# Patient Record
Sex: Female | Born: 1970 | Race: Black or African American | Hispanic: No | Marital: Married | Smoking: Never smoker
Health system: Southern US, Community
[De-identification: ages and names within clinical notes are randomized; demographics above are authoritative.]

## PROBLEM LIST (undated history)

## (undated) DIAGNOSIS — I10 Essential (primary) hypertension: Secondary | ICD-10-CM

## (undated) HISTORY — PX: NO PAST SURGERIES: SHX2092

---

## 2021-06-23 ENCOUNTER — Emergency Department (HOSPITAL_COMMUNITY)
Admission: EM | Admit: 2021-06-23 | Discharge: 2021-06-23 | Disposition: A | Discharge status: Home / Self Care | Payer: Self-pay | Attending: Emergency Medicine | Admitting: Emergency Medicine

## 2021-06-23 ENCOUNTER — Other Ambulatory Visit: Payer: Self-pay

## 2021-06-23 ENCOUNTER — Encounter (HOSPITAL_COMMUNITY): Payer: Self-pay | Admitting: Emergency Medicine

## 2021-06-23 DIAGNOSIS — G43909 Migraine, unspecified, not intractable, without status migrainosus: Secondary | ICD-10-CM | POA: Insufficient documentation

## 2021-06-23 DIAGNOSIS — G43009 Migraine without aura, not intractable, without status migrainosus: Secondary | ICD-10-CM

## 2021-06-23 DIAGNOSIS — I10 Essential (primary) hypertension: Secondary | ICD-10-CM | POA: Insufficient documentation

## 2021-06-23 HISTORY — DX: Essential (primary) hypertension: I10

## 2021-06-23 MED ORDER — DIPHENHYDRAMINE HCL 50 MG/ML IJ SOLN
25.0000 mg | Freq: Once | INTRAMUSCULAR | Status: AC
  Administered 2021-06-23: 25 mg via INTRAVENOUS
  Filled 2021-06-23: qty 1

## 2021-06-23 MED ORDER — METOCLOPRAMIDE HCL 5 MG/ML IJ SOLN
10.0000 mg | Freq: Once | INTRAMUSCULAR | Status: AC
  Administered 2021-06-23: 10 mg via INTRAVENOUS
  Filled 2021-06-23: qty 2

## 2021-06-23 MED ORDER — KETOROLAC TROMETHAMINE 30 MG/ML IJ SOLN
30.0000 mg | Freq: Once | INTRAMUSCULAR | Status: AC
  Administered 2021-06-23: 30 mg via INTRAVENOUS
  Filled 2021-06-23: qty 1

## 2021-06-23 NOTE — ED Provider Notes (Signed)
[facility] EMERGENCY DEPARTMENT Provider Note   CSN: [id number] Arrival date & time: 06/23/21  1704     History Chief Complaint  Patient presents with   Headache    Heather Roach is a 50 y.o. female.   Headache Associated symptoms: nausea and photophobia   Associated symptoms: no abdominal pain, no congestion, no diarrhea, no fever, no neck pain, no neck stiffness, no numbness, no seizures, no sore throat, no vomiting and no weakness        Heather Roach is a 50 y.o. female with past medical history of hypertension who presents to the Emergency Department complaining of gradual onset of frontal headache that began yesterday.  She describes having a throbbing pounding sensation to the front of her head.  Onset while at work yesterday.  States headache is gradually worsened today which prompted her ER visit.  She last took [place] powders at noon today without relief.  Her headache is been associated with nausea, no vomiting.  She also endorses having photophobia.  History of migraines.  She denies chest pain, abdominal pain, shortness of breath, fever or chills, or visual changes.    Past Medical History:  Diagnosis Date   Hypertension     There are no problems to display for this patient.   History reviewed. No pertinent surgical history.   OB History   No obstetric history on file.     No family history on file.  Social History   Tobacco Use   Smoking status: Never    Passive exposure: Never   Smokeless tobacco: Never    Home Medications Prior to Admission medications   Not on File    Allergies    Patient has no allergy information on record.  Review of Systems   Review of Systems  Constitutional:  Negative for appetite change, chills and fever.  HENT:  Negative for congestion and sore throat.   Eyes:  Positive for photophobia.  Respiratory:  Negative for chest tightness and shortness of breath.   Cardiovascular:  Negative for chest pain.   Gastrointestinal:  Positive for nausea. Negative for abdominal pain, diarrhea and vomiting.  Genitourinary:  Negative for dysuria.  Musculoskeletal:  Negative for arthralgias, neck pain and neck stiffness.  Neurological:  Positive for headaches. Negative for seizures, syncope, weakness and numbness.   Physical Exam Updated Vital Signs BP (!) 156/116 (BP Location: Right Arm)   Pulse 79   Temp 98.3 F (36.8 C)   Resp 18   Ht 5\' 5"  (1.651 m)   Wt 72.6 kg   SpO2 99%   BMI 26.63 kg/m   Physical Exam Constitutional:      General: She is not in acute distress.    Appearance: Normal appearance. She is not ill-appearing or toxic-appearing.  HENT:     Head: Normocephalic.     Mouth/Throat:     Mouth: Mucous membranes are moist.  Eyes:     Extraocular Movements: Extraocular movements intact.     Pupils: Pupils are equal, round, and reactive to light.  Neck:     Thyroid: No thyromegaly.     Meningeal: Kernig's sign absent.  Cardiovascular:     Rate and Rhythm: Normal rate and regular rhythm.     Pulses: Normal pulses.  Pulmonary:     Effort: Pulmonary effort is normal.     Breath sounds: Normal breath sounds. No wheezing.  Abdominal:     Palpations: Abdomen is soft.     Tenderness: There is no  abdominal tenderness. There is no guarding or rebound.  Musculoskeletal:        General: Normal range of motion.     Cervical back: Full passive range of motion without pain, normal range of motion and neck supple.  Skin:    General: Skin is warm.     Capillary Refill: Capillary refill takes less than 2 seconds.     Findings: No rash.  Neurological:     General: No focal deficit present.     Mental Status: She is alert and oriented to person, place, and time.     Sensory: No sensory deficit.     Motor: No weakness.     Comments: Cranial nerves II through XII intact.  Speech clear.  No pronator drift.  No facial droop.    ED Results / Procedures / Treatments   Labs (all labs  ordered are listed, but only abnormal results are displayed) Labs Reviewed - No data to display  EKG None  Radiology No results found.  Procedures Procedures   Medications Ordered in ED Medications  ketorolac (TORADOL) 30 MG/ML injection 30 mg (has no administration in time range)  metoCLOPramide (REGLAN) injection 10 mg (has no administration in time range)  diphenhydrAMINE (BENADRYL) injection 25 mg (has no administration in time range)    ED Course  I have reviewed the triage vital signs and the nursing notes.  Pertinent labs & imaging results that were available during my care of the patient were reviewed by me and considered in my medical decision making (see chart for details).    MDM Rules/Calculators/A&P                           Patient with a history of migraines, complains of headache gradual onset yesterday, worse today.  Headache similar to previous.  Headache associated with photophobia and nausea.  No vomiting fevers or chills.  No meningeal signs on exam.  Patient well-appearing nontoxic.  She is hypertensive with history of same.  States she took her blood pressure medication today  Plan includes treatment with migraine cocktail and reassess.  Low clinical suspicion for [facility] or meningitis.  On recheck, patient now reporting headache is much improved states she is ready for discharge home.  Repeat vitals show blood pressure also improved.  Symptoms likely related to migraine headache.  No clinical findings to suggest emergent process.  Patient appears appropriate for discharge home.  She is ambulatory with steady gait.  No focal neuro deficits  On repeat vital signs, patient remains hypertensive, but states that she feels ready to go home.  I have recommended that she recheck her blood pressure this evening before bedtime and take one half of her amlodipine before bedtime if pressure is still elevated.  Doubt any end organ damage.  She is agreeable to plan.  She will  follow-up closely with her PCP tomorrow.  Return precautions were discussed.  Final Clinical Impression(s) / ED Diagnoses Final diagnoses:  Migraine without aura and without status migrainosus, not intractable    Rx / DC Orders ED Discharge Orders     None        Rosey Bath 06/24/21 2110    Bethann Berkshire, MD 06/30/21 1027

## 2021-06-23 NOTE — ED Triage Notes (Signed)
Pt c/o headache since yesterday. C/o nausea and lightsensitivity. Hx of migraines. Took tylenol at 1200 with no relief.

## 2021-06-23 NOTE — Discharge Instructions (Addendum)
You have been treated today for migraine headache.  I recommend that you go home and rest.  Your blood pressure this evening is elevated.  Try rechecking your blood pressure at home before bedtime and take one half tablet of your amlodipine if the top number of your blood pressure is greater than 140.  Call your primary care provider tomorrow for recheck.  Return to emergency department for any new or worsening symptoms.

## 2022-02-11 ENCOUNTER — Encounter (HOSPITAL_COMMUNITY): Payer: Self-pay | Admitting: Emergency Medicine

## 2022-02-11 ENCOUNTER — Emergency Department (HOSPITAL_COMMUNITY): Payer: Self-pay

## 2022-02-11 ENCOUNTER — Other Ambulatory Visit: Payer: Self-pay

## 2022-02-11 ENCOUNTER — Emergency Department (HOSPITAL_COMMUNITY)
Admission: EM | Admit: 2022-02-11 | Discharge: 2022-02-12 | Disposition: A | Discharge status: Home / Self Care | Payer: Self-pay | Attending: Student | Admitting: Student

## 2022-02-11 DIAGNOSIS — R519 Headache, unspecified: Secondary | ICD-10-CM

## 2022-02-11 DIAGNOSIS — Z79899 Other long term (current) drug therapy: Secondary | ICD-10-CM | POA: Insufficient documentation

## 2022-02-11 DIAGNOSIS — I10 Essential (primary) hypertension: Secondary | ICD-10-CM | POA: Insufficient documentation

## 2022-02-11 LAB — COMPREHENSIVE METABOLIC PANEL
ALT: 24 U/L (ref 0–44)
AST: 20 U/L (ref 15–41)
Albumin: 4.2 g/dL (ref 3.5–5.0)
Alkaline Phosphatase: 55 U/L (ref 38–126)
Anion gap: 6 (ref 5–15)
BUN: 14 mg/dL (ref 6–20)
CO2: 24 mmol/L (ref 22–32)
Calcium: 8.8 mg/dL — ABNORMAL LOW (ref 8.9–10.3)
Chloride: 106 mmol/L (ref 98–111)
Creatinine, Ser: 0.85 mg/dL (ref 0.44–1.00)
GFR, Estimated: >60 mL/min (ref >60)
Glucose, Bld: 126 mg/dL — ABNORMAL HIGH (ref 70–99)
Potassium: 3.6 mmol/L (ref 3.5–5.1)
Sodium: 136 mmol/L (ref 135–145)
Total Bilirubin: 0.1 mg/dL — ABNORMAL LOW (ref 0.3–1.2)
Total Protein: 7.8 g/dL (ref 6.5–8.1)

## 2022-02-11 LAB — CBC WITH DIFFERENTIAL/PLATELET
Abs Immature Granulocytes: 0.03 10*3/uL (ref 0.00–0.07)
Basophils Absolute: 0.1 10*3/uL (ref 0.0–0.1)
Basophils Relative: 1 %
Eosinophils Absolute: 0.2 10*3/uL (ref 0.0–0.5)
Eosinophils Relative: 3 %
HCT: 37 % (ref 36.0–46.0)
Hemoglobin: 12.2 g/dL (ref 12.0–15.0)
Immature Granulocytes: 0 %
Lymphocytes Relative: 41 %
Lymphs Abs: 2.9 10*3/uL (ref 0.7–4.0)
MCH: 29.3 pg (ref 26.0–34.0)
MCHC: 33 g/dL (ref 30.0–36.0)
MCV: 88.7 fL (ref 80.0–100.0)
Monocytes Absolute: 0.7 10*3/uL (ref 0.1–1.0)
Monocytes Relative: 10 %
Neutro Abs: 3.2 10*3/uL (ref 1.7–7.7)
Neutrophils Relative %: 45 %
Platelets: 292 10*3/uL (ref 150–400)
RBC: 4.17 MIL/uL (ref 3.87–5.11)
RDW: 13.2 % (ref 11.5–15.5)
WBC: 7 10*3/uL (ref 4.0–10.5)
nRBC: 0 % (ref 0.0–0.2)

## 2022-02-11 LAB — URINALYSIS, ROUTINE W REFLEX MICROSCOPIC
Bilirubin Urine: NEGATIVE
Glucose, UA: NEGATIVE mg/dL
Ketones, ur: NEGATIVE mg/dL
Leukocytes,Ua: NEGATIVE
Nitrite: NEGATIVE
Protein, ur: 30 mg/dL — AB
Specific Gravity, Urine: 1.026 (ref 1.005–1.030)
pH: 5 (ref 5.0–8.0)

## 2022-02-11 LAB — TROPONIN I (HIGH SENSITIVITY): Troponin I (High Sensitivity): 4 ng/L (ref <18)

## 2022-02-11 MED ORDER — DIPHENHYDRAMINE HCL 50 MG/ML IJ SOLN
12.5000 mg | Freq: Once | INTRAMUSCULAR | Status: AC
  Administered 2022-02-11: 12.5 mg via INTRAVENOUS
  Filled 2022-02-11: qty 1

## 2022-02-11 MED ORDER — AMLODIPINE BESYLATE 5 MG PO TABS: 5.0000 mg | ORAL_TABLET | Freq: Every day | ORAL | 0 refills | Status: DC

## 2022-02-11 MED ORDER — METOCLOPRAMIDE HCL 5 MG/ML IJ SOLN
10.0000 mg | Freq: Once | INTRAMUSCULAR | Status: AC
  Administered 2022-02-11: 10 mg via INTRAVENOUS
  Filled 2022-02-11: qty 2

## 2022-02-11 MED ORDER — SODIUM CHLORIDE 0.9 % IV BOLUS
500.0000 mL | Freq: Once | INTRAVENOUS | Status: AC
  Administered 2022-02-11: 500 mL via INTRAVENOUS

## 2022-02-11 MED ORDER — AMLODIPINE BESYLATE 5 MG PO TABS
5.0000 mg | ORAL_TABLET | Freq: Once | ORAL | Status: AC
  Administered 2022-02-11: 5 mg via ORAL
  Filled 2022-02-11: qty 1

## 2022-02-11 MED ORDER — ACETAMINOPHEN 500 MG PO TABS
1000.0000 mg | ORAL_TABLET | Freq: Once | ORAL | Status: AC
  Administered 2022-02-11: 1000 mg via ORAL
  Filled 2022-02-11: qty 2

## 2022-02-11 NOTE — ED Provider Notes (Signed)
?[facility] EMERGENCY DEPARTMENT ?Provider Note ? ? ?CSN: [id number] ?Arrival date & time: 02/11/22  1830 ? ?  ? ?History ? ?Chief Complaint  ?Patient presents with  ? Headache  ? ? ?Heather Roach is a 51 y.o. female. ? ?HPI ?Patient is a [age] year old female with a history of hypertension who presents to the emergency department due to headaches.  Her wife is at bedside and states that they were in [place] about 2 weeks ago and were eating fatty foods including pork and ribs.  Shortly thereafter she began developing waxing and waning frontal headaches.  States that they have been occurring daily for the past 2 weeks.  A few days ago she began developing intermittent nausea and vomiting with the headaches as well.  Yesterday she then began developing cramping in the left arm that she states has now spread to the left side of her chest.  Denies any shortness of breath, abdominal pain, visual changes, numbness, weakness.  States that she used to take amlodipine for hypertension but discontinued this about 2 years ago. ?  ? ?Home Medications ?Prior to Admission medications   ?Medication Sig Start Date End Date Taking? Authorizing Provider  ?amLODipine (NORVASC) 5 MG tablet Take 1 tablet (5 mg total) by mouth daily. 02/11/22  Yes Placido SouJoldersma, Noha Karasik, PA-C  ?   ? ?Allergies    ?Patient has no known allergies.   ? ?Review of Systems   ?Review of Systems  ?All other systems reviewed and are negative. ?Ten systems reviewed and are negative for acute change, except as noted in the HPI.   ?Physical Exam ?Updated Vital Signs ?BP (!) 162/108   Pulse 83   Temp 98.3 ?F (36.8 ?C) (Oral)   Resp (!) 31   Ht 5\' 5"  (1.651 m)   Wt 72.6 kg   LMP 12/26/2021 (Approximate)   SpO2 99%   BMI 26.63 kg/m?  ?Physical Exam ?Vitals and nursing note reviewed.  ?Constitutional:   ?   General: She is not in acute distress. ?   Appearance: Normal appearance. She is well-developed. She is not ill-appearing, toxic-appearing or diaphoretic.  ?HENT:  ?    Head: Normocephalic and atraumatic.  ?   Right Ear: External ear normal.  ?   Left Ear: External ear normal.  ?   Nose: Nose normal.  ?   Mouth/Throat:  ?   Mouth: Mucous membranes are moist.  ?   Pharynx: Oropharynx is clear. No oropharyngeal exudate or posterior oropharyngeal erythema.  ?Eyes:  ?   General: No scleral icterus. ?   Extraocular Movements: Extraocular movements intact.  ?   Right eye: Normal extraocular motion and no nystagmus.  ?   Left eye: Normal extraocular motion and no nystagmus.  ?   Pupils: Pupils are equal, round, and reactive to light. Pupils are equal.  ?   Right eye: Pupil is round and reactive.  ?   Left eye: Pupil is round and reactive.  ?Cardiovascular:  ?   Rate and Rhythm: Normal rate and regular rhythm.  ?   Pulses: Normal pulses.  ?   Heart sounds: Normal heart sounds. No murmur heard. ?  No friction rub. No gallop.  ?Pulmonary:  ?   Effort: Pulmonary effort is normal. No respiratory distress.  ?   Breath sounds: Normal breath sounds. No stridor. No wheezing, rhonchi or rales.  ?Abdominal:  ?   General: Abdomen is flat.  ?   Palpations: Abdomen is soft.  ?  Tenderness: There is no abdominal tenderness.  ?Musculoskeletal:     ?   General: Normal range of motion.  ?   Cervical back: Normal range of motion and neck supple. No tenderness.  ?Skin: ?   General: Skin is warm and dry.  ?Neurological:  ?   General: No focal deficit present.  ?   Mental Status: She is alert and oriented to person, place, and time.  ?   GCS: GCS eye subscore is 4. GCS verbal subscore is 5. GCS motor subscore is 6.  ?   Comments: A&O x3.  Speaking clearly and coherently.  Moving all 4 extremities.  No gross deficits.  ?Psychiatric:     ?   Mood and Affect: Mood normal.     ?   Behavior: Behavior normal.  ? ?ED Results / Procedures / Treatments   ?Labs ?(all labs ordered are listed, but only abnormal results are displayed) ?Labs Reviewed  ?COMPREHENSIVE METABOLIC PANEL - Abnormal; Notable for the following  components:  ?    Result Value  ? Glucose, Bld 126 (*)   ? Calcium 8.8 (*)   ? Total Bilirubin 0.1 (*)   ? All other components within normal limits  ?URINALYSIS, ROUTINE W REFLEX MICROSCOPIC - Abnormal; Notable for the following components:  ? APPearance HAZY (*)   ? Hgb urine dipstick SMALL (*)   ? Protein, ur 30 (*)   ? Bacteria, UA RARE (*)   ? All other components within normal limits  ?CBC WITH DIFFERENTIAL/PLATELET  ?TROPONIN I (HIGH SENSITIVITY)  ?TROPONIN I (HIGH SENSITIVITY)  ? ?EKG ?EKG Interpretation ? ?Date/Time:  Tuesday February 11 2022 21:52:00 EDT ?Ventricular Rate:  82 ?PR Interval:  154 ?QRS Duration: 96 ?QT Interval:  378 ?QTC Calculation: 441 ?R Axis:   11 ?Text Interpretation: Normal sinus rhythm Normal ECG No previous ECGs available Confirmed by Zadie Rhine ([id number]) on 02/11/2022 11:11:07 PM ? ?Radiology ?CT Head Wo Contrast ? ?Result Date: 02/11/2022 ?CLINICAL DATA:  Headache. EXAM: CT HEAD WITHOUT CONTRAST TECHNIQUE: Contiguous axial images were obtained from the base of the skull through the vertex without intravenous contrast. RADIATION DOSE REDUCTION: This exam was performed according to the departmental dose-optimization program which includes automated exposure control, adjustment of the mA and/or kV according to patient size and/or use of iterative reconstruction technique. COMPARISON:  None. FINDINGS: Brain: The ventricles and sulci are appropriate size for the patient's age. The gray-white matter discrimination is preserved. There is no acute intracranial hemorrhage. No mass effect or midline shift. No extra-axial fluid collection. Vascular: No hyperdense vessel or unexpected calcification. Skull: Normal. Negative for fracture or focal lesion. Sinuses/Orbits: No acute finding. Other: None IMPRESSION: Unremarkable noncontrast CT of the brain. Electronically Signed   By: Elgie Collard M.D.   On: 02/11/2022 21:28  ? ?DG Chest Portable 1 View ? ?Result Date: 02/11/2022 ?CLINICAL DATA:   Chest pain. EXAM: PORTABLE CHEST 1 VIEW COMPARISON:  None. FINDINGS: The heart size and mediastinal contours are within normal limits. Both lungs are clear. The visualized skeletal structures are unremarkable. IMPRESSION: No active disease. Electronically Signed   By: Elgie Collard M.D.   On: 02/11/2022 21:20   ? ?Procedures ?Procedures  ? ?Medications Ordered in ED ?Medications  ?acetaminophen (TYLENOL) tablet 1,000 mg (1,000 mg Oral Given 02/11/22 2125)  ?diphenhydrAMINE (BENADRYL) injection 12.5 mg (12.5 mg Intravenous Given 02/11/22 2140)  ?metoCLOPramide (REGLAN) injection 10 mg (10 mg Intravenous Given 02/11/22 2140)  ?sodium chloride 0.9 % bolus 500 mL (  500 mLs Intravenous New Bag/Given 02/11/22 2140)  ?amLODipine (NORVASC) tablet 5 mg (5 mg Oral Given 02/11/22 2235)  ? ?ED Course/ Medical Decision Making/ A&P ?  ?                        ?Medical Decision Making ?Amount and/or Complexity of Data Reviewed ?Labs: ordered. ?Radiology: ordered. ? ?Risk ?OTC drugs. ?Prescription drug management. ? ?Pt is a 51 y.o. female with history of hypertension who presents to the emergency department with a waxing waning headache for the past 2 weeks as well as cramping in the left arm and left chest.  Patient states she used to take amlodipine but discontinued this medication about 2 years ago and has not been on any antihypertensive since. ? ?Labs: ?CBC without abnormalities. ?CMP with glucose 126, calcium of 8.8, total bilirubin 0.1. ?UA with small hemoglobin, 30 protein, rare bacteria. ?Troponin of 4. ? ?Imaging: ?Chest x-ray shows no active disease. ?CT scan of the head without contrast shows unremarkable noncontrast CT of the brain. ? ?I, Placido Sou, PA-C, personally reviewed and evaluated these images and lab results as part of my medical decision-making. ? ?On my initial exam heart is regular rate and rhythm without murmurs, rubs, or gallops.  Lungs are clear to auscultation bilaterally.  Abdomen is soft and  nontender.  A&O x3.  Moving all 4 extremities with ease.  No gross deficits.  Given her complaints of cramping in the chest, left arm, nausea, vomiting, as well as significantly elevated blood pressure I obtained a

## 2022-02-11 NOTE — Discharge Instructions (Addendum)
I am prescribing you a blood pressure medication called amlodipine.  Please take this once per day as prescribed. ? ?Below is the contact information for [facility] and wellness.  Please give them a call at your convenience to schedule an appointment for reevaluation. ? ?If you develop any new or worsening symptoms please come back to the emergency department. ?

## 2022-02-11 NOTE — ED Triage Notes (Signed)
Pt has c/o of headache since 01/27/22, has develop with emesis last couple days, pt hypertensive in triage 181/116. ?

## 2022-02-12 LAB — TROPONIN I (HIGH SENSITIVITY): Troponin I (High Sensitivity): 4 ng/L (ref <18)

## 2022-02-12 NOTE — ED Provider Notes (Signed)
Patient improved, no acute distress.  She feels comfortable for discharge home.  We discussed return precautions ?  ?Zadie Rhine, MD ?02/12/22 0019 ? ?

## 2022-02-20 ENCOUNTER — Ambulatory Visit (INDEPENDENT_AMBULATORY_CARE_PROVIDER_SITE_OTHER): Payer: Self-pay | Admitting: Physician Assistant

## 2022-02-20 ENCOUNTER — Encounter: Payer: Self-pay | Admitting: Physician Assistant

## 2022-02-20 VITALS — BP 169/108 | HR 102 | Temp 98.2°F | Resp 18 | Ht 65.0 in | Wt 165.0 lb

## 2022-02-20 DIAGNOSIS — Z114 Encounter for screening for human immunodeficiency virus [HIV]: Secondary | ICD-10-CM

## 2022-02-20 DIAGNOSIS — Z6827 Body mass index (BMI) 27.0-27.9, adult: Secondary | ICD-10-CM

## 2022-02-20 DIAGNOSIS — Z13220 Encounter for screening for lipoid disorders: Secondary | ICD-10-CM

## 2022-02-20 DIAGNOSIS — R7309 Other abnormal glucose: Secondary | ICD-10-CM

## 2022-02-20 DIAGNOSIS — R000 Tachycardia, unspecified: Secondary | ICD-10-CM

## 2022-02-20 DIAGNOSIS — Z1159 Encounter for screening for other viral diseases: Secondary | ICD-10-CM

## 2022-02-20 DIAGNOSIS — Z1231 Encounter for screening mammogram for malignant neoplasm of breast: Secondary | ICD-10-CM

## 2022-02-20 DIAGNOSIS — I10 Essential (primary) hypertension: Secondary | ICD-10-CM

## 2022-02-20 MED ORDER — HYDROCHLOROTHIAZIDE 25 MG PO TABS
25.0000 mg | ORAL_TABLET | Freq: Every day | ORAL | 1 refills | Status: DC
Start: 2022-02-20 — End: 2022-04-07

## 2022-02-20 NOTE — Progress Notes (Signed)
? ?New Patient Office Visit ? ?Subjective:  ?Patient ID: Heather Roach, female    DOB: 1971-02-09  Age: 51 y.o. MRN: [id number] ? ?CC:  ?Chief Complaint  ?Patient presents with  ? Hypertension  ? ? ?HPI ?Heather Roach states that she was seen at the emergency department on February 11, 2022.  Hospital course: ? ?HPI ?Patient is a 51 year old female with a history of hypertension who presents to the emergency department due to headaches.  Her wife is at bedside and states that they were in [place] about 2 weeks ago and were eating fatty foods including pork and ribs.  Shortly thereafter she began developing waxing and waning frontal headaches.  States that they have been occurring daily for the past 2 weeks.  A few days ago she began developing intermittent nausea and vomiting with the headaches as well.  Yesterday she then began developing cramping in the left arm that she states has now spread to the left side of her chest.  Denies any shortness of breath, abdominal pain, visual changes, numbness, weakness.  States that she used to take amlodipine for hypertension but discontinued this about 2 years ago. ? ?Medical Decision Making ?Amount and/or Complexity of Data Reviewed ?Labs: ordered. ?Radiology: ordered. ?  ?Risk ?OTC drugs. ?Prescription drug management. ?  ?Pt is a 51 y.o. female with history of hypertension who presents to the emergency department with a waxing waning headache for the past 2 weeks as well as cramping in the left arm and left chest.  Patient states she used to take amlodipine but discontinued this medication about 2 years ago and has not been on any antihypertensive since. ?  ?Labs: ?CBC without abnormalities. ?CMP with glucose 126, calcium of 8.8, total bilirubin 0.1. ?UA with small hemoglobin, 30 protein, rare bacteria. ?Troponin of 4. ?  ?Imaging: ?Chest x-ray shows no active disease. ?CT scan of the head without contrast shows unremarkable noncontrast CT of the brain. ?  ?I, Placido SouLogan Joldersma,  PA-C, personally reviewed and evaluated these images and lab results as part of my medical decision-making. ?  ?On my initial exam heart is regular rate and rhythm without murmurs, rubs, or gallops.  Lungs are clear to auscultation bilaterally.  Abdomen is soft and nontender.  A&O x3.  Moving all 4 extremities with ease.  No gross deficits.  Given her complaints of cramping in the chest, left arm, nausea, vomiting, as well as significantly elevated blood pressure I obtained a basic cardiac work-up as well as a UA.  Patient also endorsing waxing waning headaches for the past 2 weeks so a CT scan was obtained of the head as well.  Imaging of the head appears reassuring.  Chest x-ray also negative.  CBC without abnormalities.  CMP with a calcium of 8.8 but otherwise no electrolyte derangements.  Normal kidney function.  No signs of endorgan damage.  Exam not consistent with hypertensive encephalopathy. ? ?Patient's symptoms were treated with Tylenol, Reglan, Benadryl, IV fluids, as well as amlodipine.  She reports significant improvement in her headache.  Will discharge on a course of amlodipine.  We will also [facility]. ? ?It is the end of my shift and patient care is being transferred to Dr. Bebe ShaggyWickline.  Patient pending a second troponin.   ?  ? ?States today that she did start taking the amlodipine, states that she started having lower lip swelling after 2 days, states that she did speak with a pharmacist who  told her to stop the medication, but then states when they pharmacist saw her elevated blood pressure readings told her to continue the medication.  States that she is no longer having the lower lip swelling.  States that her blood pressure readings continue to be elevated at home, similar to today's.  States that she did take amlodipine in the past but does endorse that she was not compliant to the medication.  ? ? ?Past Medical History:  ?Diagnosis Date  ?  Hypertension   ? ? ?History reviewed. No pertinent surgical history. ? ?History reviewed. No pertinent family history. ? ?Social History  ? ?Socioeconomic History  ? Marital status: Married  ?  Spouse name: Not on file  ? Number of children: Not on file  ? Years of education: Not on file  ? Highest education level: Not on file  ?Occupational History  ? Not on file  ?Tobacco Use  ? Smoking status: Never  ?  Passive exposure: Never  ? Smokeless tobacco: Never  ?Substance and Sexual Activity  ? Alcohol use: Not on file  ? Drug use: Not on file  ? Sexual activity: Not on file  ?Other Topics Concern  ? Not on file  ?Social History Narrative  ? Not on file  ? ?Social Determinants of Health  ? ?Financial Resource Strain: Not on file  ?Food Insecurity: Not on file  ?Transportation Needs: Not on file  ?Physical Activity: Not on file  ?Stress: Not on file  ?Social Connections: Not on file  ?Intimate Partner Violence: Not on file  ? ? ?ROS ?Review of Systems  ?Constitutional: Negative.   ?HENT: Negative.    ?Eyes: Negative.   ?Cardiovascular:  Negative for chest pain and palpitations.  ?Gastrointestinal: Negative.   ?Endocrine: Negative.   ?Genitourinary: Negative.   ?Musculoskeletal: Negative.   ?Skin: Negative.   ?Allergic/Immunologic: Negative.   ?Neurological: Negative.   ?Hematological: Negative.   ?Psychiatric/Behavioral: Negative.    ? ?Objective:  ? ?Today's Vitals: BP (!) 169/108 (BP Location: Right Arm, Patient Position: Sitting, Cuff Size: Normal)   Pulse (!) 102   Temp 98.2 ?F (36.8 ?C) (Oral)   Resp 18   Ht 5\' 5"  (1.651 m)   Wt 165 lb (74.8 kg)   LMP 01/27/2022   SpO2 98%   BMI 27.46 kg/m?  ? ?Physical Exam ?Vitals and nursing note reviewed.  ?Constitutional:   ?   Appearance: Normal appearance.  ?HENT:  ?   Head: Normocephalic and atraumatic.  ?   Right Ear: External ear normal.  ?   Left Ear: External ear normal.  ?   Nose: Nose normal.  ?   Mouth/Throat:  ?   Mouth: Mucous membranes are moist.  ?    Pharynx: Oropharynx is clear.  ?Eyes:  ?   Extraocular Movements: Extraocular movements intact.  ?   Conjunctiva/sclera: Conjunctivae normal.  ?   Pupils: Pupils are equal, round, and reactive to light.  ?Cardiovascular:  ?   Rate and Rhythm: Regular rhythm. Tachycardia present.  ?   Pulses: Normal pulses.  ?   Heart sounds: Normal heart sounds.  ?Pulmonary:  ?   Effort: Pulmonary effort is normal.  ?   Breath sounds: Normal breath sounds.  ?Musculoskeletal:  ?   Cervical back: Normal range of motion and neck supple.  ?Skin: ?   General: Skin is warm and dry.  ?Neurological:  ?   General: No focal deficit present.  ?   Mental Status: She is  alert and oriented to person, place, and time.  ?Psychiatric:     ?   Mood and Affect: Mood normal.     ?   Behavior: Behavior normal.     ?   Thought Content: Thought content normal.     ?   Judgment: Judgment normal.  ? ? ?Assessment & Plan:  ? ?Problem List Items Addressed This Visit   ? ?  ? Cardiovascular and Mediastinum  ? Essential hypertension - Primary  ? Relevant Medications  ? hydrochlorothiazide (HYDRODIURIL) 25 MG tablet  ? Other Relevant Orders  ? TSH  ?  ? Other  ? Tachycardia  ? Elevated random blood glucose level  ? Relevant Orders  ? Hemoglobin A1c  ? Hypocalcemia  ? ?Other Visit Diagnoses   ? ? Screening for HIV (human immunodeficiency virus)      ? Relevant Orders  ? HIV antibody (with reflex)  ? Encounter for HCV screening test for low risk patient      ? Relevant Orders  ? HCV Ab w Reflex to Quant PCR  ? Screening mammogram for breast cancer      ? Relevant Orders  ? MM DIGITAL SCREENING BILATERAL  ? Screening, lipid      ? Relevant Orders  ? Lipid panel  ? BMI 27.0-27.9,adult      ? ?  ? ? ?Outpatient Encounter Medications as of 02/20/2022  ?Medication Sig  ? hydrochlorothiazide (HYDRODIURIL) 25 MG tablet Take 1 tablet (25 mg total) by mouth daily.  ? [DISCONTINUED] amLODipine (NORVASC) 5 MG tablet Take 1 tablet (5 mg total) by mouth daily.  ? ?No  facility-administered encounter medications on file as of 02/20/2022.  ?.1. Essential hypertension ?Given possible adverse reaction to amlodipine, and patient's need for increase in medication, will do trial of hydrochlorothiazide, disc

## 2022-02-20 NOTE — Patient Instructions (Signed)
You are going to stop taking amlodipine and start taking hydrochlorothiazide 25 mg once daily in the morning. ? ?I encourage you to continue checking your blood pressure and pulse at home, keep a written log and have available for all office visits. ? ?Please return for fasting labs to be completed next week. We will call you with your lab results and we will call you with the results of your A1c since we have to send it out. ? ?I do encourage you to work on lowering your sodium intake and increasing your water intake. ? ?Roney Jaffe, PA-C ?Physician Assistant ?[facility] ?[email or web address] ? ? ?Low-Sodium Eating Plan ?Sodium, which is an element that makes up salt, helps you maintain a healthy balance of fluids in your body. Too much sodium can increase your blood pressure and cause fluid and waste to be held in your body. ?Your health care provider or dietitian may recommend following this plan if you have high blood pressure (hypertension), kidney disease, liver disease, or heart failure. Eating less sodium can help lower your blood pressure, reduce swelling, and protect your heart, liver, and kidneys. ?What are tips for following this plan? ?Reading food labels ?The Nutrition Facts label lists the amount of sodium in one serving of the food. If you eat more than one serving, you must multiply the listed amount of sodium by the number of servings. ?Choose foods with less than 140 mg of sodium per serving. ?Avoid foods with 300 mg of sodium or more per serving. ?Shopping ? ?Look for lower-sodium products, often labeled as "low-sodium" or "no salt added." ?Always check the sodium content, even if foods are labeled as "unsalted" or "no salt added." ?Buy fresh foods. ?Avoid canned foods and pre-made or frozen meals. ?Avoid canned, cured, or processed meats. ?Buy breads that have less than 80 mg of sodium per slice. ?Cooking ? ?Eat more home-cooked food and  less restaurant, buffet, and fast food. ?Avoid adding salt when cooking. Use salt-free seasonings or herbs instead of table salt or sea salt. Check with your health care provider or pharmacist before using salt substitutes. ?Cook with plant-based oils, such as canola, sunflower, or olive oil. ?Meal planning ?When eating at a restaurant, ask that your food be prepared with less salt or no salt, if possible. Avoid dishes labeled as brined, pickled, cured, smoked, or made with soy sauce, miso, or teriyaki sauce. ?Avoid foods that contain MSG (monosodium glutamate). MSG is sometimes added to [place] food, bouillon, and some canned foods. ?Make meals that can be grilled, baked, poached, roasted, or steamed. These are generally made with less sodium. ?General information ?Most people on this plan should limit their sodium intake to 1,500-2,000 mg (milligrams) of sodium each day. ?What foods should I eat? ?Fruits ?Fresh, frozen, or canned fruit. Fruit juice. ?Vegetables ?Fresh or frozen vegetables. "No salt added" canned vegetables. "No salt added" tomato sauce and paste. Low-sodium or reduced-sodium tomato and vegetable juice. ?Grains ?Low-sodium cereals, including oats, puffed wheat and rice, and shredded wheat. Low-sodium crackers. Unsalted rice. Unsalted pasta. Low-sodium bread. Whole-grain breads and whole-grain pasta. ?Meats and other proteins ?Fresh or frozen (no salt added) meat, poultry, seafood, and fish. Low-sodium canned tuna and salmon. Unsalted nuts. Dried peas, beans, and lentils without added salt. Unsalted canned beans. Eggs. Unsalted nut butters. ?Dairy ?Milk. Soy milk. Cheese that is naturally low in sodium, such as ricotta cheese, fresh mozzarella, or Swiss cheese. Low-sodium or reduced-sodium cheese. Cream cheese. Yogurt. ?Seasonings and condiments ?  Fresh and dried herbs and spices. Salt-free seasonings. Low-sodium mustard and ketchup. Sodium-free salad dressing. Sodium-free light mayonnaise. Fresh or  refrigerated horseradish. Lemon juice. Vinegar. ?Other foods ?Homemade, reduced-sodium, or low-sodium soups. Unsalted popcorn and pretzels. Low-salt or salt-free chips. ?The items listed above may not be a complete list of foods and beverages you can eat. Contact a dietitian for more information. ?What foods should I avoid? ?Vegetables ?Sauerkraut, pickled vegetables, and relishes. Olives. [place] fries. Onion rings. Regular canned vegetables (not low-sodium or reduced-sodium). Regular canned tomato sauce and paste (not low-sodium or reduced-sodium). Regular tomato and vegetable juice (not low-sodium or reduced-sodium). Frozen vegetables in sauces. ?Grains ?Instant hot cereals. Bread stuffing, pancake, and biscuit mixes. Croutons. Seasoned rice or pasta mixes. Noodle soup cups. Boxed or frozen macaroni and cheese. Regular salted crackers. Self-rising flour. ?Meats and other proteins ?Meat or fish that is salted, canned, smoked, spiced, or pickled. Precooked or cured meat, such as sausages or meat loaves. Tomasa Blase. Ham. Pepperoni. Hot dogs. Corned beef. Chipped beef. Salt pork. Jerky. Pickled herring. Anchovies and sardines. Regular canned tuna. Salted nuts. ?Dairy ?Processed cheese and cheese spreads. Hard cheeses. Cheese curds. Blue cheese. Feta cheese. String cheese. Regular cottage cheese. Buttermilk. Canned milk. ?Fats and oils ?Salted butter. Regular margarine. Ghee. Bacon fat. ?Seasonings and condiments ?Onion salt, garlic salt, seasoned salt, table salt, and sea salt. Canned and packaged gravies. Worcestershire sauce. Tartar sauce. Barbecue sauce. Teriyaki sauce. Soy sauce, including reduced-sodium. Steak sauce. Fish sauce. Oyster sauce. Cocktail sauce. Horseradish that you find on the shelf. Regular ketchup and mustard. Meat flavorings and tenderizers. Bouillon cubes. Hot sauce. Pre-made or packaged marinades. Pre-made or packaged taco seasonings. Relishes. Regular salad dressings. Salsa. ?Other foods ?Salted  popcorn and pretzels. Corn chips and puffs. Potato and tortilla chips. Canned or dried soups. Pizza. Frozen entrees and pot pies. ?The items listed above may not be a complete list of foods and beverages you should avoid. Contact a dietitian for more information. ?Summary ?Eating less sodium can help lower your blood pressure, reduce swelling, and protect your heart, liver, and kidneys. ?Most people on this plan should limit their sodium intake to 1,500-2,000 mg (milligrams) of sodium each day. ?Canned, boxed, and frozen foods are high in sodium. Restaurant foods, fast foods, and pizza are also very high in sodium. You also get sodium by adding salt to food. ?Try to cook at home, eat more fresh fruits and vegetables, and eat less fast food and canned, processed, or prepared foods. ?This information is not intended to replace advice given to you by your health care provider. Make sure you discuss any questions you have with your health care provider. ?Document Revised: 12/09/2019 Document Reviewed: 10/05/2019 ?Elsevier Patient Education ? 2022 Elsevier Inc. ? ?

## 2022-02-21 DIAGNOSIS — R000 Tachycardia, unspecified: Secondary | ICD-10-CM | POA: Insufficient documentation

## 2022-02-21 DIAGNOSIS — R7309 Other abnormal glucose: Secondary | ICD-10-CM | POA: Insufficient documentation

## 2022-02-21 DIAGNOSIS — I10 Essential (primary) hypertension: Secondary | ICD-10-CM | POA: Insufficient documentation

## 2022-02-24 ENCOUNTER — Other Ambulatory Visit: Payer: Self-pay

## 2022-02-24 ENCOUNTER — Telehealth: Payer: Self-pay | Admitting: Physician Assistant

## 2022-02-24 ENCOUNTER — Other Ambulatory Visit: Payer: Self-pay | Admitting: *Deleted

## 2022-02-24 DIAGNOSIS — Z114 Encounter for screening for human immunodeficiency virus [HIV]: Secondary | ICD-10-CM

## 2022-02-24 DIAGNOSIS — Z1159 Encounter for screening for other viral diseases: Secondary | ICD-10-CM

## 2022-02-24 DIAGNOSIS — I10 Essential (primary) hypertension: Secondary | ICD-10-CM

## 2022-02-24 DIAGNOSIS — Z13220 Encounter for screening for lipoid disorders: Secondary | ICD-10-CM

## 2022-02-24 NOTE — Telephone Encounter (Signed)
Patient will need to schedule a FU for elevated BP if numbers are consistently this high.

## 2022-02-24 NOTE — Telephone Encounter (Signed)
Copied from CRM [id number]. Topic: General - Other ?>> Feb 24, 2022  9:17 AM Jaquita Rector A wrote: ?Reason for CRM: Patient called in to inform nurse that her BP is 167/107 and pulse is 109. Please be advised and patient can be reached at  Ph# [phone] ?

## 2022-02-25 ENCOUNTER — Encounter: Payer: Self-pay | Admitting: Physician Assistant

## 2022-02-25 LAB — TSH: TSH: 4.44 u[IU]/mL (ref 0.450–4.500)

## 2022-02-25 LAB — LIPID PANEL
Chol/HDL Ratio: 4.5 ratio — ABNORMAL HIGH (ref 0.0–4.4)
Cholesterol, Total: 147 mg/dL (ref 100–199)
HDL: 33 mg/dL — ABNORMAL LOW (ref >39)
LDL Chol Calc (NIH): 91 mg/dL (ref 0–99)
Triglycerides: 128 mg/dL (ref 0–149)
VLDL Cholesterol Cal: 23 mg/dL (ref 5–40)

## 2022-02-25 LAB — HCV INTERPRETATION

## 2022-02-25 LAB — HCV AB W REFLEX TO QUANT PCR: HCV Ab: NONREACTIVE

## 2022-02-25 LAB — SPECIMEN STATUS REPORT

## 2022-02-25 LAB — HIV ANTIBODY (ROUTINE TESTING W REFLEX): HIV Screen 4th Generation wRfx: NONREACTIVE

## 2022-02-26 ENCOUNTER — Other Ambulatory Visit: Payer: Self-pay

## 2022-02-26 ENCOUNTER — Ambulatory Visit (INDEPENDENT_AMBULATORY_CARE_PROVIDER_SITE_OTHER): Payer: Self-pay | Admitting: Physician Assistant

## 2022-02-26 ENCOUNTER — Encounter: Payer: Self-pay | Admitting: Physician Assistant

## 2022-02-26 VITALS — BP 148/109 | HR 103 | Temp 98.7°F | Resp 18 | Ht 65.0 in | Wt 162.0 lb

## 2022-02-26 DIAGNOSIS — E119 Type 2 diabetes mellitus without complications: Secondary | ICD-10-CM

## 2022-02-26 DIAGNOSIS — I10 Essential (primary) hypertension: Secondary | ICD-10-CM

## 2022-02-26 DIAGNOSIS — R7309 Other abnormal glucose: Secondary | ICD-10-CM

## 2022-02-26 DIAGNOSIS — R000 Tachycardia, unspecified: Secondary | ICD-10-CM

## 2022-02-26 DIAGNOSIS — Z6826 Body mass index (BMI) 26.0-26.9, adult: Secondary | ICD-10-CM

## 2022-02-26 MED ORDER — METFORMIN HCL ER 500 MG PO TB24
500.0000 mg | ORAL_TABLET | Freq: Every day | ORAL | 1 refills | Status: DC
Start: 2022-02-26 — End: 2022-04-07
  Filled 2022-02-26: qty 30, 30d supply, fill #0

## 2022-02-26 MED ORDER — ACCU-CHEK GUIDE VI STRP
1.0000 | ORAL_STRIP | Freq: Two times a day (BID) | 12 refills | Status: AC
  Filled 2022-02-26: qty 100, 50d supply, fill #0

## 2022-02-26 MED ORDER — METOPROLOL SUCCINATE ER 25 MG PO TB24: 25.0000 mg | ORAL_TABLET | Freq: Every day | ORAL | 1 refills | Status: DC

## 2022-02-26 MED ORDER — ACCU-CHEK AVIVA PLUS VI STRP
ORAL_STRIP | 12 refills | Status: DC
  Filled 2022-02-26: qty 100, fill #0

## 2022-02-26 MED ORDER — ACCU-CHEK SOFTCLIX LANCETS MISC
12 refills | Status: AC
  Filled 2022-02-26: qty 100, 25d supply, fill #0

## 2022-02-26 MED ORDER — ACCU-CHEK GUIDE W/DEVICE KIT
1.0000 [IU] | PACK | Freq: Every day | 0 refills | Status: AC
  Filled 2022-02-26: qty 1, 30d supply, fill #0

## 2022-02-26 MED ORDER — METOPROLOL SUCCINATE ER 25 MG PO TB24
25.0000 mg | ORAL_TABLET | Freq: Every day | ORAL | 3 refills | Status: DC
  Filled 2022-02-26: qty 90, 90d supply, fill #0

## 2022-02-26 MED ORDER — ACCU-CHEK AVIVA PLUS W/DEVICE KIT
1.0000 [IU] | PACK | Freq: Once | 0 refills | Status: DC
  Filled 2022-02-26: qty 1, fill #0

## 2022-02-26 NOTE — Telephone Encounter (Signed)
Patient was seen today and concern addressed  ?

## 2022-02-26 NOTE — Progress Notes (Signed)
Patient received results while in office and A1C was ran, resulting in 6.7. ?

## 2022-02-26 NOTE — Progress Notes (Signed)
? ?Established Patient Office Visit ? ?Subjective:  ?Patient ID: Heather Roach, female    DOB: 08-Jun-1971  Age: 51 y.o. MRN: [id number] ? ?CC:  ?Chief Complaint  ?Patient presents with  ? Hypertension  ? ? ?HPI ?[name] reports that she continues to have elevated blood pressure readings, similar to today and slightly higher.  States that she has also been having elevated pulse readings.  States they have been 108, 112.  States that she has been checking her blood pressure and pulse at [facility] and [facility]. ? ?States that she has been experiencing a frontal headache and felt she was having some heaviness in her chest last night.  States the heaviness has improved, however she does continue to have a frontal headache. ?  ? ? ?Past Medical History:  ?Diagnosis Date  ? Hypertension   ? ? ?History reviewed. No pertinent surgical history. ? ?History reviewed. No pertinent family history. ? ?Social History  ? ?Socioeconomic History  ? Marital status: Married  ?  Spouse name: Not on file  ? Number of children: Not on file  ? Years of education: Not on file  ? Highest education level: Not on file  ?Occupational History  ? Not on file  ?Tobacco Use  ? Smoking status: Never  ?  Passive exposure: Never  ? Smokeless tobacco: Never  ?Substance and Sexual Activity  ? Alcohol use: Not Currently  ? Drug use: Not Currently  ? Sexual activity: Yes  ?Other Topics Concern  ? Not on file  ?Social History Narrative  ? Not on file  ? ?Social Determinants of Health  ? ?Financial Resource Strain: Not on file  ?Food Insecurity: Not on file  ?Transportation Needs: Not on file  ?Physical Activity: Not on file  ?Stress: Not on file  ?Social Connections: Not on file  ?Intimate Partner Violence: Not on file  ? ? ?Outpatient Medications Prior to Visit  ?Medication Sig Dispense Refill  ? hydrochlorothiazide (HYDRODIURIL) 25 MG tablet Take 1 tablet (25 mg total) by mouth daily. 30 tablet 1  ? ?No facility-administered medications prior to visit.   ? ? ?No Known Allergies ? ?ROS ?Review of Systems  ?Constitutional: Negative.   ?HENT: Negative.    ?Eyes: Negative.   ?Respiratory:  Negative for chest tightness and shortness of breath.   ?Cardiovascular:  Negative for chest pain.  ?Gastrointestinal: Negative.   ?Endocrine: Negative.   ?Genitourinary: Negative.   ?Musculoskeletal: Negative.   ?Skin: Negative.   ?Allergic/Immunologic: Negative.   ?Neurological:  Positive for headaches. Negative for dizziness, seizures, syncope and weakness.  ?Hematological: Negative.   ?Psychiatric/Behavioral: Negative.    ? ?  ?Objective:  ?  ?Physical Exam ?Vitals and nursing note reviewed.  ?Constitutional:   ?   General: She is not in acute distress. ?   Appearance: Normal appearance. She is not ill-appearing.  ?HENT:  ?   Head: Normocephalic and atraumatic.  ?   Right Ear: External ear normal.  ?   Left Ear: External ear normal.  ?   Nose: Nose normal.  ?   Mouth/Throat:  ?   Mouth: Mucous membranes are moist.  ?   Pharynx: Oropharynx is clear.  ?Eyes:  ?   Extraocular Movements: Extraocular movements intact.  ?   Conjunctiva/sclera: Conjunctivae normal.  ?   Pupils: Pupils are equal, round, and reactive to light.  ?Cardiovascular:  ?   Rate and Rhythm: Normal rate and regular rhythm.  ?   Pulses: Normal pulses.  ?  Heart sounds: Normal heart sounds.  ?Pulmonary:  ?   Breath sounds: Normal breath sounds.  ?Musculoskeletal:     ?   General: Normal range of motion.  ?   Cervical back: Normal range of motion and neck supple.  ?Skin: ?   General: Skin is warm and dry.  ?Neurological:  ?   General: No focal deficit present.  ?   Mental Status: She is alert and oriented to person, place, and time.  ?Psychiatric:     ?   Mood and Affect: Mood normal.     ?   Behavior: Behavior normal.     ?   Thought Content: Thought content normal.     ?   Judgment: Judgment normal.  ? ? ?BP (!) 148/109 (BP Location: Right Arm, Patient Position: Sitting, Cuff Size: Normal)   Pulse (!) 103   Temp  98.7 ?F (37.1 ?C) (Oral)   Resp 18   Ht $R'5\' 5"'rv$  (1.651 m)   Wt 162 lb (73.5 kg)   LMP 01/27/2022   SpO2 95%   BMI 26.96 kg/m?  ?Wt Readings from Last 3 Encounters:  ?02/26/22 162 lb (73.5 kg)  ?02/20/22 165 lb (74.8 kg)  ?02/11/22 160 lb (72.6 kg)  ? ? ? ?Health Maintenance Due  ?Topic Date Due  ? COVID-19 Vaccine (1) Never done  ? URINE MICROALBUMIN  Never done  ? TETANUS/TDAP  Never done  ? PAP SMEAR-Modifier  Never done  ? COLONOSCOPY (Pts 45-48yrs Insurance coverage will need to be confirmed)  Never done  ? MAMMOGRAM  Never done  ? Zoster Vaccines- Shingrix (1 of 2) Never done  ? ? ?There are no preventive care reminders to display for this patient. ? ?Lab Results  ?Component Value Date  ? TSH 4.440 02/24/2022  ? ?Lab Results  ?Component Value Date  ? WBC 7.0 02/11/2022  ? HGB 12.2 02/11/2022  ? HCT 37.0 02/11/2022  ? MCV 88.7 02/11/2022  ? PLT 292 02/11/2022  ? ?Lab Results  ?Component Value Date  ? NA 136 02/11/2022  ? K 3.6 02/11/2022  ? CO2 24 02/11/2022  ? GLUCOSE 126 (H) 02/11/2022  ? BUN 14 02/11/2022  ? CREATININE 0.85 02/11/2022  ? BILITOT 0.1 (L) 02/11/2022  ? ALKPHOS 55 02/11/2022  ? AST 20 02/11/2022  ? ALT 24 02/11/2022  ? PROT 7.8 02/11/2022  ? ALBUMIN 4.2 02/11/2022  ? CALCIUM 8.8 (L) 02/11/2022  ? ANIONGAP 6 02/11/2022  ? ?Lab Results  ?Component Value Date  ? CHOL 147 02/24/2022  ? ?Lab Results  ?Component Value Date  ? HDL 33 (L) 02/24/2022  ? ?Lab Results  ?Component Value Date  ? [place] 91 02/24/2022  ? ?Lab Results  ?Component Value Date  ? TRIG 128 02/24/2022  ? ?Lab Results  ?Component Value Date  ? CHOLHDL 4.5 (H) 02/24/2022  ? ?No results found for: HGBA1C ? ?  ?Assessment & Plan:  ? ?Problem List Items Addressed This Visit   ? ?  ? Cardiovascular and Mediastinum  ? Essential hypertension - Primary  ? Relevant Medications  ? metoprolol succinate (TOPROL-XL) 25 MG 24 hr tablet  ?  ? Other  ? Tachycardia  ? Relevant Medications  ? metoprolol succinate (TOPROL-XL) 25 MG 24 hr tablet  ?  Elevated random blood glucose level  ? ?Other Visit Diagnoses   ? ? Type 2 diabetes mellitus without complication, without long-term current use of insulin ([place])      ? Relevant Medications  ?  metFORMIN (GLUCOPHAGE-XR) 500 MG 24 hr tablet  ? Accu-Chek Softclix Lancets lancets  ? Blood Glucose Monitoring Suppl (ACCU-CHEK GUIDE) w/Device KIT  ? glucose blood (ACCU-CHEK GUIDE) test strip  ? ?  ? ? ?Meds ordered this encounter  ?Medications  ? DISCONTD: metoprolol succinate (TOPROL-XL) 25 MG 24 hr tablet  ?  Sig: Take 1 tablet (25 mg total) by mouth daily.  ?  Dispense:  30 tablet  ?  Refill:  1  ?  Order Specific Question:   Supervising Provider  ?  Answer:   Elsie Stain [[id number]]  ? metFORMIN (GLUCOPHAGE-XR) 500 MG 24 hr tablet  ?  Sig: Take 1 tablet (500 mg total) by mouth daily with breakfast.  ?  Dispense:  30 tablet  ?  Refill:  1  ?  Order Specific Question:   Supervising Provider  ?  Answer:   Elsie Stain [[id number]]  ? DISCONTD: Blood Glucose Monitoring Suppl (ACCU-CHEK AVIVA PLUS) w/Device KIT  ?  Sig: 1 Units by Does not apply route once for 1 dose.  ?  Dispense:  1 kit  ?  Refill:  0  ?  Order Specific Question:   Supervising Provider  ?  Answer:   Elsie Stain [[id number]]  ? Accu-Chek Softclix Lancets lancets  ?  Sig: Use as instructed  ?  Dispense:  100 each  ?  Refill:  12  ?  Order Specific Question:   Supervising Provider  ?  Answer:   Elsie Stain [[id number]]  ? DISCONTD: glucose blood (ACCU-CHEK AVIVA PLUS) test strip  ?  Sig: Use as instructed  ?  Dispense:  100 each  ?  Refill:  12  ?  Order Specific Question:   Supervising Provider  ?  Answer:   Elsie Stain [[id number]]  ? metoprolol succinate (TOPROL-XL) 25 MG 24 hr tablet  ?  Sig: Take 1 tablet (25 mg total) by mouth daily.  ?  Dispense:  90 tablet  ?  Refill:  3  ?  Order Specific Question:   Supervising Provider  ?  Answer:   Elsie Stain [[id number]]  ? Blood Glucose Monitoring Suppl (ACCU-CHEK GUIDE) w/Device KIT  ?  Sig: 1 Units by Does  not apply route daily.  ?  Dispense:  1 kit  ?  Refill:  0  ?  Order Specific Question:   Supervising Provider  ?  Answer:   Elsie Stain [[id number]]  ? glucose blood (ACCU-CHEK GUIDE) test strip  ?  Sig: USE AS

## 2022-02-26 NOTE — Progress Notes (Signed)
Patient took medication around 6:15 AM. Patient has not eaten today. ?Patient reports frontal HA for the past 3 days with elevated BP and HR readings. ?

## 2022-02-26 NOTE — Patient Instructions (Addendum)
You are going to add metoprolol 25 mg to the hydrochlorothiazide.  I do encourage you to check your blood pressure at home on a daily basis, keep a written log and have available for all office visits. ? ?You are going to start taking metformin once daily.  I encourage you to check your blood sugar levels at least once a day, keep a written log and have available for all office visits.  I encourage you to work on significantly reducing the amount of added sugars in your diet. ? ?Please let us know if there is anything else we can do for you ? ?Kennieth Rad, PA-C ?Physician Assistant ?[place] ?[email or web address] ? ? ?Sinus Tachycardia ?Sinus tachycardia is a kind of fast heartbeat. In sinus tachycardia, the heart beats more than 100 times a minute. Sinus tachycardia starts in a part of the heart called the sinus node. Sinus tachycardia may be harmless, or it may be a sign of a serious condition. ?What are the causes? ?This condition may be caused by: ?Exercise or exertion. ?A fever. ?Pain. ?Loss of body fluids (dehydration). ?Severe bleeding (hemorrhage). ?Anxiety and stress. ?Certain substances, including: ?Alcohol. ?Caffeine. ?Tobacco and nicotine products. ?Cold medicines. ?Illegal drugs. ?Medical conditions including: ?Heart disease. ?An infection. ?An overactive thyroid (hyperthyroidism). ?A lack of red blood cells (anemia). ?What are the signs or symptoms? ?Symptoms of this condition include: ?A feeling that the heart is beating quickly (palpitations). ?Suddenly noticing your heartbeat (cardiac awareness). ?Dizziness. ?Tiredness (fatigue). ?Shortness of breath. ?Chest pain. ?Nausea. ?Fainting. ?How is this diagnosed? ?This condition is diagnosed with: ?A physical exam. ?Other tests, such as: ?Blood tests. ?An electrocardiogram (ECG). This test measures the electrical activity of the heart. ?Ambulatory cardiac monitor. This records your heartbeats for  24 hours or more. ?You may be referred to a heart specialist (cardiologist). ?How is this treated? ?Treatment for this condition depends on the cause or the underlying condition. Treatment may involve: ?Treating the underlying condition. ?Taking new medicines or changing your current medicines as told by your health care provider. ?Making changes to your diet or lifestyle. ?Follow these instructions at home: ?Lifestyle ? ?Do not use any products that contain nicotine or tobacco, such as cigarettes and e-cigarettes. If you need help quitting, ask your health care provider. ?Do not use illegal drugs, such as cocaine. ?Learn relaxation methods to help you when you get stressed or anxious. These include deep breathing. ?Avoid caffeine or other stimulants. ?Alcohol use ? ?Do not drink alcohol if: ?Your health care provider tells you not to drink. ?You are pregnant, may be pregnant, or are planning to become pregnant. ?If you drink alcohol, limit how much you have: ?0-1 drink a day for women. ?0-2 drinks a day for men. ?Be aware of how much alcohol is in your drink. In the [place] one drink equals one typical bottle of beer (12 oz), one-half glass of wine (5 oz), or one shot of hard liquor (1? oz). ?General instructions ?Drink enough fluids to keep your urine pale yellow. ?Take over-the-counter and prescription medicines only as told by your health care provider. ?Keep all follow-up visits as told by your health care provider. This is important. ?Contact a health care provider if you have: ?A fever. ?Vomiting or diarrhea that does not go away. ?Get help right away if you: ?Have pain in your chest, upper arms, jaw, or neck. ?Become weak or dizzy. ?Feel faint. ?Have palpitations that do not go away. ?Summary ?In sinus tachycardia, the heart  beats more than 100 times a minute. ?Sinus tachycardia may be harmless, or it may be a sign of a serious condition. ?Treatment for this condition depends on the cause or the underlying  condition. ?Get help right away if you have pain in your chest, upper arms, jaw, or neck. ?This information is not intended to replace advice given to you by your health care provider. Make sure you discuss any questions you have with your health care provider. ?Document Revised: 03/14/2021 Document Reviewed: 03/14/2021 ?Elsevier Patient Education ? [place]. ? ?

## 2022-03-10 ENCOUNTER — Other Ambulatory Visit: Payer: Self-pay

## 2022-03-10 DIAGNOSIS — Z1231 Encounter for screening mammogram for malignant neoplasm of breast: Secondary | ICD-10-CM

## 2022-03-20 ENCOUNTER — Other Ambulatory Visit: Payer: Self-pay

## 2022-04-01 NOTE — Progress Notes (Signed)
Patient ID: Heather Roach, female    DOB: Mar 20, 1971  MRN: [id number]  CC: Hypertension   Subjective: Heather Roach is a 51 y.o. female who presents for hypertension.   Her concerns today include:  Hypertension follow-up: Last appointment 02/26/2022 with Carrolyn Meiers, PA. Amlodipine discontinued at that time due to side effect. HCTZ and Metoprolol initiated as replacement.  Currently taking: see medication list Med Adherence: [x]  Yes    []  No Medication side effects: []  Yes    [x]  No Adherence with salt restriction (low-salt diet): [x]  Yes   []  No Exercise outside of normal routine: Yes []  No [x]  Home Monitoring?: [x]  Yes    []  No Monitoring Frequency: [x]  Yes    []  No Home BP results range: [x]  Yes, 170's/100's Smoking []  Yes [x]  No SOB? []  Yes    [x]  No Chest Pain?: []  Yes    [x]  No  2. Diabetes type 2 follow-up: Last appointment 02/26/2022 with Carrolyn Meiers, PA. Hemoglobin A1c 6.7%. Newly diagnosed diabetic. Metformin initiated. Today reports doing well on Metformin, no issues/concerns. Home blood sugars in the 100's.    Patient Active Problem List   Diagnosis Date Noted   Essential hypertension 02/21/2022   Tachycardia 02/21/2022   Elevated random blood glucose level 02/21/2022   Hypocalcemia 02/21/2022     Current Outpatient Medications on File Prior to Visit  Medication Sig Dispense Refill   Accu-Chek Softclix Lancets lancets Use as instructed 100 each 12   Blood Glucose Monitoring Suppl (ACCU-CHEK GUIDE) w/Device KIT 1 Units by Does not apply route daily. 1 kit 0   glucose blood (ACCU-CHEK GUIDE) test strip USE AS DIRECTED TWICE A DAY TO TEST BLOOD SUGARS 100 each 12   metoprolol succinate (TOPROL-XL) 25 MG 24 hr tablet Take 1 tablet (25 mg total) by mouth daily. 90 tablet 3   No current facility-administered medications on file prior to visit.    No Known Allergies  Social History   Socioeconomic History   Marital status: Married    Spouse name: Not on file    Number of children: Not on file   Years of education: Not on file   Highest education level: Not on file  Occupational History   Not on file  Tobacco Use   Smoking status: Never    Passive exposure: Never   Smokeless tobacco: Never  Substance and Sexual Activity   Alcohol use: Not Currently   Drug use: Not Currently   Sexual activity: Yes  Other Topics Concern   Not on file  Social History Narrative   Not on file   Social Determinants of Health   Financial Resource Strain: Not on file  Food Insecurity: Not on file  Transportation Needs: Not on file  Physical Activity: Not on file  Stress: Not on file  Social Connections: Not on file  Intimate Partner Violence: Not on file    No family history on file.  No past surgical history on file.  ROS: Review of Systems Negative except as stated above  PHYSICAL EXAM: BP (!) 144/87 (BP Location: Left Arm, Patient Position: Sitting, Cuff Size: Large)   Pulse 82   Temp 98.3 F (36.8 C)   Resp 18   Ht 5\' 5"  (1.651 m)   Wt 160 lb (72.6 kg)   SpO2 98%   BMI 26.63 kg/m   Physical Exam HENT:     Head: Normocephalic and atraumatic.  Eyes:     Extraocular Movements: Extraocular movements intact.  Conjunctiva/sclera: Conjunctivae normal.     Pupils: Pupils are equal, round, and reactive to light.  Cardiovascular:     Rate and Rhythm: Normal rate and regular rhythm.     Pulses: Normal pulses.     Heart sounds: Normal heart sounds.  Pulmonary:     Effort: Pulmonary effort is normal.     Breath sounds: Normal breath sounds.  Musculoskeletal:     Cervical back: Normal range of motion and neck supple.  Neurological:     General: No focal deficit present.     Mental Status: She is alert and oriented to person, place, and time.  Psychiatric:        Mood and Affect: Mood normal.        Behavior: Behavior normal.   ASSESSMENT AND PLAN: 1. Essential (primary) hypertension: 2. Tachycardia: - Blood pressure not at goal  during today's visit. Patient asymptomatic without chest pressure, chest pain, palpitations, shortness of breath, worst headache of life, and any additional red flag symptoms. - Patient intolerant to Amlodipine with dermatological side effect.  - Continue Metoprolol as prescribed. No refills needed as of present.  - Increase Hydrochlorothiazide from 25 mg daily to 50 mg daily.  - Follow-up with primary provider in 2 weeks or sooner if needed for blood pressure check.  - hydrochlorothiazide (HYDRODIURIL) 50 MG tablet; Take 1 tablet (50 mg total) by mouth daily.  Dispense: 90 tablet; Refill: 0  3. Type 2 diabetes mellitus without complication, without long-term current use of insulin ([place]): - Hemoglobin A1c 6.7% on 02/26/2022. - Continue Metformin as prescribed.  - Discussed the importance of healthy eating habits, low-carbohydrate diet, low-sugar diet, regular aerobic exercise (at least 150 minutes a week as tolerated) and medication compliance to achieve or maintain control of diabetes. - To achieve an A1C goal of less than or equal to 7.0 percent, a fasting blood sugar of 80 to 130 mg/dL and a postprandial glucose (90 to 120 minutes after a meal) less than 180 mg/dL. In the event of sugars less than 60 mg/dl or greater than 400 mg/dl please notify the clinic ASAP. It is recommended that you undergo annual eye exams and annual foot exams. - Follow-up with primary provider around 05/28/2022 for repeat hemoglobin A1c. Follow-up with primary provider sooner if needed.  - metFORMIN (GLUCOPHAGE-XR) 500 MG 24 hr tablet; Take 1 tablet (500 mg total) by mouth daily with breakfast.  Dispense: 90 tablet; Refill: 0   Patient was given the opportunity to ask questions.  Patient verbalized understanding of the plan and was able to repeat key elements of the plan. Patient was given clear instructions to go to Emergency Department or return to medical center if symptoms don't improve, worsen, or new problems  develop.The patient verbalized understanding.   Requested Prescriptions   Signed Prescriptions Disp Refills   hydrochlorothiazide (HYDRODIURIL) 50 MG tablet 90 tablet 0    Sig: Take 1 tablet (50 mg total) by mouth daily.   metFORMIN (GLUCOPHAGE-XR) 500 MG 24 hr tablet 90 tablet 0    Sig: Take 1 tablet (500 mg total) by mouth daily with breakfast.    Return for Physical per patient preference, Follow-Up or next available 2 weeks HTN and 05/28/2022 DM.  [name], NP

## 2022-04-07 ENCOUNTER — Ambulatory Visit (INDEPENDENT_AMBULATORY_CARE_PROVIDER_SITE_OTHER): Payer: Self-pay | Admitting: Family

## 2022-04-07 VITALS — BP 144/87 | HR 82 | Temp 98.3°F | Resp 18 | Ht 65.0 in | Wt 160.0 lb

## 2022-04-07 DIAGNOSIS — R000 Tachycardia, unspecified: Secondary | ICD-10-CM

## 2022-04-07 DIAGNOSIS — I10 Essential (primary) hypertension: Secondary | ICD-10-CM

## 2022-04-07 DIAGNOSIS — E119 Type 2 diabetes mellitus without complications: Secondary | ICD-10-CM

## 2022-04-07 MED ORDER — HYDROCHLOROTHIAZIDE 50 MG PO TABS: 50.0000 mg | ORAL_TABLET | Freq: Every day | ORAL | 0 refills | Status: DC

## 2022-04-07 MED ORDER — METFORMIN HCL ER 500 MG PO TB24: 500.0000 mg | ORAL_TABLET | Freq: Every day | ORAL | 0 refills | Status: DC

## 2022-04-07 NOTE — Progress Notes (Signed)
Pt presents for hypertension f/u, pt states to get high readings at home

## 2022-04-07 NOTE — Patient Instructions (Signed)
Thank you for choosing Primary Care at [facility] for your medical home!    Heather Roach was seen by [name], NP today.   Ellin Mayhew primary care provider is [name], NP.   For the best care possible,  you should try to see Durene Fruits, NP whenever you come to office.   We look forward to seeing you again soon!  If you have any questions about your visit today,  please call us at [phone]  Or feel free to reach your provider via [place].    Keeping you healthy   Get these tests Blood pressure- Have your blood pressure checked once a year by your healthcare provider.  Normal blood pressure is 120/80. Weight- Have your body mass index (BMI) calculated to screen for obesity.  BMI is a measure of body fat based on height and weight. You can also calculate your own BMI at [employer].it. Cholesterol- Have your cholesterol checked regularly starting at age 40, sooner may be necessary if you have diabetes, high blood pressure, if a family member developed heart diseases at an early age or if you smoke.  Chlamydia, HIV, and other sexual transmitted disease- Get screened each year until the age of 49 then within three months of each new sexual partner. Diabetes- Have your blood sugar checked regularly if you have high blood pressure, high cholesterol, a family history of diabetes or if you are overweight.   Get these vaccines Flu shot- Every fall. Tetanus shot- Every 10 years. Menactra- Single dose; prevents meningitis.   Take these steps Don't smoke- If you do smoke, ask your healthcare provider about quitting. For tips on how to quit, go to www.smokefree.gov or call 1-800-QUIT-NOW. Be physically active- Exercise 5 days a week for at least 30 minutes.  If you are not already physically active start slow and gradually work up to 30 minutes of moderate physical activity.  Examples of moderate activity include walking briskly, mowing the yard, dancing,  swimming bicycling, etc. Eat a healthy diet- Eat a variety of healthy foods such as fruits, vegetables, low fat milk, low fat cheese, yogurt, lean meats, poultry, fish, beans, tofu, etc.  For more information on healthy eating, go to www.thenutritionsource.org Drink alcohol in moderation- Limit alcohol intake two drinks or less a day.  Never drink and drive. Dentist- Brush and floss teeth twice daily; visit your dentis twice a year. Depression-Your emotional health is as important as your physical health.  If you're feeling down, losing interest in things you normally enjoy please talk with your healthcare provider. Gun Safety- If you keep a gun in your home, keep it unloaded and with the safety lock on.  Bullets should be stored separately. Helmet use- Always wear a helmet when riding a motorcycle, bicycle, rollerblading or skateboarding. Safe sex- If you may be exposed to a sexually transmitted infection, use a condom Seat belts- Seat bels can save your life; always wear one. Smoke/Carbon Monoxide detectors- These detectors need to be installed on the appropriate level of your home.  Replace batteries at least once a year. Skin Cancer- When out in the sun, cover up and use sunscreen SPF 15 or higher. Violence- If anyone is threatening or hurting you, please tell your healthcare provider.

## 2022-04-10 ENCOUNTER — Ambulatory Visit
Admission: RE | Admit: 2022-04-10 | Discharge: 2022-04-10 | Disposition: A | Discharge status: Home / Self Care | Payer: No Typology Code available for payment source | Source: Ambulatory Visit | Attending: Physician Assistant | Admitting: Physician Assistant

## 2022-04-10 DIAGNOSIS — Z1231 Encounter for screening mammogram for malignant neoplasm of breast: Secondary | ICD-10-CM

## 2022-04-16 NOTE — Progress Notes (Signed)
Patient ID: Heather Roach, female    DOB: 11-30-1970  MRN: [id number]  CC: Hypertension Follow-Up  Subjective: Heather Roach is a 51 y.o. female who presents for hypertension follow-up.  Her concerns today include:  Hypertension follow-up: 04/07/2022: - Patient intolerant to Amlodipine with dermatological side effect.  - Continue Metoprolol as prescribed. No refills needed as of present.  - Increase Hydrochlorothiazide from 25 mg daily to 50 mg daily.  - Follow-up with primary provider in 2 weeks or sooner if needed for blood pressure check.   04/22/2022: Doing well on current regimen. No side effects. No issues/concerns. Denies chest pain, shortness of breath, worst headache of life and additional red flag symptoms.   Patient Active Problem List   Diagnosis Date Noted   Essential hypertension 02/21/2022   Tachycardia 02/21/2022   Elevated random blood glucose level 02/21/2022   Hypocalcemia 02/21/2022     Current Outpatient Medications on File Prior to Visit  Medication Sig Dispense Refill   Accu-Chek Softclix Lancets lancets Use as instructed 100 each 12   Blood Glucose Monitoring Suppl (ACCU-CHEK GUIDE) w/Device KIT 1 Units by Does not apply route daily. 1 kit 0   glucose blood (ACCU-CHEK GUIDE) test strip USE AS DIRECTED TWICE A DAY TO TEST BLOOD SUGARS 100 each 12   hydrochlorothiazide (HYDRODIURIL) 50 MG tablet Take 1 tablet (50 mg total) by mouth daily. 90 tablet 0   metFORMIN (GLUCOPHAGE-XR) 500 MG 24 hr tablet Take 1 tablet (500 mg total) by mouth daily with breakfast. 90 tablet 0   metoprolol succinate (TOPROL-XL) 25 MG 24 hr tablet Take 1 tablet (25 mg total) by mouth daily. 90 tablet 3   No current facility-administered medications on file prior to visit.    No Known Allergies  Social History   Socioeconomic History   Marital status: Married    Spouse name: Not on file   Number of children: Not on file   Years of education: Not on file   Highest education  level: Not on file  Occupational History   Not on file  Tobacco Use   Smoking status: Never    Passive exposure: Never   Smokeless tobacco: Never  Substance and Sexual Activity   Alcohol use: Not Currently   Drug use: Not Currently   Sexual activity: Yes  Other Topics Concern   Not on file  Social History Narrative   Not on file   Social Determinants of Health   Financial Resource Strain: Not on file  Food Insecurity: Not on file  Transportation Needs: Not on file  Physical Activity: Not on file  Stress: Not on file  Social Connections: Not on file  Intimate Partner Violence: Not on file    No family history on file.  No past surgical history on file.  ROS: Review of Systems Negative except as stated above  PHYSICAL EXAM: BP 133/90 (BP Location: Left Arm, Patient Position: Sitting, Cuff Size: Large)   Pulse 88   Temp 98 F (36.7 C)   Resp 18   Ht $R'5\' 5"'Lj$  (1.651 m)   Wt 160 lb (72.6 kg)   LMP 04/02/2022 (Approximate)   SpO2 98%   BMI 26.63 kg/m   Physical Exam HENT:     Head: Normocephalic and atraumatic.  Eyes:     Extraocular Movements: Extraocular movements intact.     Conjunctiva/sclera: Conjunctivae normal.     Pupils: Pupils are equal, round, and reactive to light.  Cardiovascular:     Rate  and Rhythm: Normal rate and regular rhythm.     Pulses: Normal pulses.     Heart sounds: Normal heart sounds.  Pulmonary:     Effort: Pulmonary effort is normal.     Breath sounds: Normal breath sounds.  Musculoskeletal:     Cervical back: Normal range of motion and neck supple.  Neurological:     General: No focal deficit present.     Mental Status: She is alert and oriented to person, place, and time.  Psychiatric:        Mood and Affect: Mood normal.        Behavior: Behavior normal.    ASSESSMENT AND PLAN: 1. Essential (primary) hypertension - Continue Hydrochlorothiazide and Metoprolol as prescribed. No refills needed as of present. - Counseled on  blood pressure goal of less than 130/80, low-sodium, DASH diet, medication compliance, and 150 minutes of moderate intensity exercise per week as tolerated. Counseled on medication adherence and adverse effects. - BMP to evaluate kidney function and electrolyte balance. - Follow-up with primary provider in 4 months or sooner if needed. - Basic Metabolic Panel    Patient was given the opportunity to ask questions.  Patient verbalized understanding of the plan and was able to repeat key elements of the plan. Patient was given clear instructions to go to Emergency Department or return to medical center if symptoms don't improve, worsen, or new problems develop.The patient verbalized understanding.   Orders Placed This Encounter  Procedures   Basic Metabolic Panel     Requested Prescriptions    No prescriptions requested or ordered in this encounter    Return in about 4 months (around 08/22/2022) for Follow-Up or next available HTN.  [name], NP

## 2022-04-21 ENCOUNTER — Ambulatory Visit: Payer: Self-pay | Admitting: *Deleted

## 2022-04-21 ENCOUNTER — Telehealth: Payer: Self-pay

## 2022-04-21 NOTE — Telephone Encounter (Signed)
-----   Message from Roney Jaffe, [place] sent at 04/15/2022  9:28 AM EDT ----- Please call patient and let her know that mammogram did not show any findings suspicious for malignancy, repeat in one 1  year

## 2022-04-21 NOTE — Telephone Encounter (Signed)
Pt given lab results per notes of Maurene Capes, PA 04/15/22 on 04/21/22. Pt verbalized understanding mammogram did not show any findings suspicious for malignancy, repeat in one 1 year.

## 2022-04-22 ENCOUNTER — Ambulatory Visit (INDEPENDENT_AMBULATORY_CARE_PROVIDER_SITE_OTHER): Payer: Self-pay | Admitting: Family

## 2022-04-22 VITALS — BP 133/90 | HR 88 | Temp 98.0°F | Resp 18 | Ht 65.0 in | Wt 160.0 lb

## 2022-04-22 DIAGNOSIS — I10 Essential (primary) hypertension: Secondary | ICD-10-CM

## 2022-04-22 NOTE — Progress Notes (Signed)
.  Pt presents for hypertension f/u   

## 2022-04-23 LAB — BASIC METABOLIC PANEL
BUN/Creatinine Ratio: 17 (ref 9–23)
BUN: 17 mg/dL (ref 6–24)
CO2: 23 mmol/L (ref 20–29)
Calcium: 9.8 mg/dL (ref 8.7–10.2)
Chloride: 96 mmol/L (ref 96–106)
Creatinine, Ser: 1.02 mg/dL — ABNORMAL HIGH (ref 0.57–1.00)
Glucose: 159 mg/dL — ABNORMAL HIGH (ref 70–99)
Potassium: 3.8 mmol/L (ref 3.5–5.2)
Sodium: 134 mmol/L (ref 134–144)
eGFR: 67 mL/min/{1.73_m2} (ref >59)

## 2022-06-11 ENCOUNTER — Ambulatory Visit: Payer: Self-pay | Admitting: Family Medicine

## 2022-08-13 NOTE — Progress Notes (Signed)
Patient ID: Heather Roach, female    DOB: 1971-10-24  MRN: [id number]  CC: Chronic Care Management  Subjective: Heather Roach is a 51 y.o. female who presents for chronic care management. She is accompanied by her wife, Burna Mortimer, via [place].   Her concerns today include:  Patient states she has not taken blood pressure and diabetes medications for awhile but may take sometimes as needed. Wife confirms has been about two months of patient not taking medications consistently. Patient reports she usually takes blood pressure medications in the morning before work. Notices on the mornings she does not eat breakfast feels nauseated after taking blood pressure medications. Otherwise reports she feels fine and denies red flag symptoms. Checking her blood pressure at the local pharmacy on occasion. States she had no side effects from Metformin. Reports she is not taking Metformin because she has limited sugar in her diet. She is checking blood sugars at home and states normal readings. No further issues/concerns for today.  Patient Active Problem List   Diagnosis Date Noted   Essential hypertension 02/21/2022   Tachycardia 02/21/2022   Elevated random blood glucose level 02/21/2022   Hypocalcemia 02/21/2022     Current Outpatient Medications on File Prior to Visit  Medication Sig Dispense Refill   Accu-Chek Softclix Lancets lancets Use as instructed 100 each 12   Blood Glucose Monitoring Suppl (ACCU-CHEK GUIDE) w/Device KIT 1 Units by Does not apply route daily. 1 kit 0   glucose blood (ACCU-CHEK GUIDE) test strip USE AS DIRECTED TWICE A DAY TO TEST BLOOD SUGARS 100 each 12   No current facility-administered medications on file prior to visit.    No Known Allergies  Social History   Socioeconomic History   Marital status: Married    Spouse name: Not on file   Number of children: Not on file   Years of education: Not on file   Highest education level: Not on file  Occupational History    Not on file  Tobacco Use   Smoking status: Never    Passive exposure: Never   Smokeless tobacco: Never  Substance and Sexual Activity   Alcohol use: Not Currently   Drug use: Not Currently   Sexual activity: Yes  Other Topics Concern   Not on file  Social History Narrative   Not on file   Social Determinants of Health   Financial Resource Strain: Not on file  Food Insecurity: Not on file  Transportation Needs: Not on file  Physical Activity: Not on file  Stress: Not on file  Social Connections: Not on file  Intimate Partner Violence: Not on file    History reviewed. No pertinent family history.  History reviewed. No pertinent surgical history.  ROS: Review of Systems Negative except as stated above  PHYSICAL EXAM: BP (!) 142/84 (BP Location: Left Arm, Patient Position: Sitting, Cuff Size: Normal)   Pulse 78   Temp 98.3 F (36.8 C)   Resp 16   Ht $R'5\' 5"'KL$  (1.651 m)   Wt 166 lb (75.3 kg)   SpO2 96%   BMI 27.62 kg/m   Physical Exam HENT:     Head: Normocephalic and atraumatic.  Eyes:     Extraocular Movements: Extraocular movements intact.     Conjunctiva/sclera: Conjunctivae normal.     Pupils: Pupils are equal, round, and reactive to light.  Cardiovascular:     Rate and Rhythm: Normal rate and regular rhythm.     Pulses: Normal pulses.  Heart sounds: Normal heart sounds.  Pulmonary:     Effort: Pulmonary effort is normal.     Breath sounds: Normal breath sounds.  Musculoskeletal:     Cervical back: Normal range of motion and neck supple.  Neurological:     General: No focal deficit present.     Mental Status: She is alert and oriented to person, place, and time.  Psychiatric:        Mood and Affect: Mood normal.        Behavior: Behavior normal.    Results for orders placed or performed in visit on 08/22/22  POCT glycosylated hemoglobin (Hb A1C)  Result Value Ref Range   Hemoglobin A1C 7.0 (A) 4.0 - 5.6 %   HbA1c POC (<> result, manual entry)      HbA1c, POC (prediabetic range)     HbA1c, POC (controlled diabetic range)       ASSESSMENT AND PLAN: 1. Primary hypertension 2. Tachycardia - Blood pressure not at goal during today's visit. Patient asymptomatic without chest pressure, chest pain, palpitations, shortness of breath, worst headache of life, and any additional red flag symptoms. - Resume Hydrochlorothiazide and Metoprolol Succinate as prescribed. Counseled to take daily with supper.  - Counseled on blood pressure goal of less than 130/80, low-sodium, DASH diet, medication compliance, and 150 minutes of moderate intensity exercise per week as tolerated. Counseled on medication adherence and adverse effects. - Follow-up with primary provider in 2 weeks or sooner if needed for blood pressure check. - metoprolol succinate (TOPROL-XL) 25 MG 24 hr tablet; Take 1 tablet (25 mg total) by mouth daily.  Dispense: 30 tablet; Refill: 2 - hydrochlorothiazide (HYDRODIURIL) 50 MG tablet; Take 1 tablet (50 mg total) by mouth daily.  Dispense: 30 tablet; Refill: 2  3. Type 2 diabetes mellitus without complication, without long-term current use of insulin ([facility]) - Hemoglobin A1c at goal 7%, goal 7%.  - Resume Metformin as prescribed. Counseled to take daily with supper.  - Discussed the importance of healthy eating habits, low-carbohydrate diet, low-sugar diet, regular aerobic exercise (at least 150 minutes a week as tolerated) and medication compliance to achieve or maintain control of diabetes. - Follow-up with primary provider in 3 months or sooner if needed.  - POCT glycosylated hemoglobin (Hb A1C) - metFORMIN (GLUCOPHAGE-XR) 500 MG 24 hr tablet; Take 1 tablet (500 mg total) by mouth daily with supper.  Dispense: 30 tablet; Refill: 2  Patient was given the opportunity to ask questions.  Patient verbalized understanding of the plan and was able to repeat key elements of the plan. Patient was given clear instructions to go to Emergency  Department or return to medical center if symptoms don't improve, worsen, or new problems develop.The patient verbalized understanding.   Orders Placed This Encounter  Procedures   POCT glycosylated hemoglobin (Hb A1C)     Requested Prescriptions   Signed Prescriptions Disp Refills   metoprolol succinate (TOPROL-XL) 25 MG 24 hr tablet 30 tablet 2    Sig: Take 1 tablet (25 mg total) by mouth daily.   hydrochlorothiazide (HYDRODIURIL) 50 MG tablet 30 tablet 2    Sig: Take 1 tablet (50 mg total) by mouth daily.   metFORMIN (GLUCOPHAGE-XR) 500 MG 24 hr tablet 30 tablet 2    Sig: Take 1 tablet (500 mg total) by mouth daily with supper.    Return in about 3 months (around 11/22/2022) for Follow-Up or next available chronic care mgmt and 2 weeks bp check .  Gyneth Hubka  Zachery Dauer, NP

## 2022-08-22 ENCOUNTER — Encounter: Payer: Self-pay | Admitting: Family

## 2022-08-22 ENCOUNTER — Ambulatory Visit (INDEPENDENT_AMBULATORY_CARE_PROVIDER_SITE_OTHER): Payer: Self-pay | Admitting: Family

## 2022-08-22 VITALS — BP 142/84 | HR 78 | Temp 98.3°F | Resp 16 | Ht 65.0 in | Wt 166.0 lb

## 2022-08-22 DIAGNOSIS — R000 Tachycardia, unspecified: Secondary | ICD-10-CM

## 2022-08-22 DIAGNOSIS — I10 Essential (primary) hypertension: Secondary | ICD-10-CM

## 2022-08-22 DIAGNOSIS — E119 Type 2 diabetes mellitus without complications: Secondary | ICD-10-CM

## 2022-08-22 LAB — POCT GLYCOSYLATED HEMOGLOBIN (HGB A1C): Hemoglobin A1C: 7 % — AB (ref 4.0–5.6)

## 2022-08-22 MED ORDER — HYDROCHLOROTHIAZIDE 50 MG PO TABS: 50.0000 mg | ORAL_TABLET | Freq: Every day | ORAL | 2 refills | Status: DC

## 2022-08-22 MED ORDER — METFORMIN HCL ER 500 MG PO TB24: 500.0000 mg | ORAL_TABLET | Freq: Every day | ORAL | 2 refills | Status: DC

## 2022-08-22 MED ORDER — METOPROLOL SUCCINATE ER 25 MG PO TB24: 25.0000 mg | ORAL_TABLET | Freq: Every day | ORAL | 2 refills | Status: DC

## 2022-08-22 NOTE — Patient Instructions (Signed)
Living With Diabetes Diabetes (type 1 diabetes mellitus or type 2 diabetes mellitus) is a condition in which the body does not have enough of a hormone called insulin, or the body does not respond properly to insulin. Normally, insulin allows sugars (glucose) to enter cells in the body. With diabetes, extra glucose builds up in the blood instead of going into cells. This results in high blood glucose (hyperglycemia). How to manage lifestyle changes Managing diabetes includes medical treatments as well as lifestyle changes. If diabetes is not managed well, serious physical and emotional complications can occur. Taking good care of yourself means that you are responsible for: Monitoring glucose regularly. Eating a healthy diet. Exercising regularly. Meeting with health care providers. Taking medicines as directed. Most people feel some stress about managing their diabetes. When this stress becomes too much, it is known as diabetes-related distress. This is very common. Living with diabetes can place you at risk for diabetes distress, depression, or anxiety. These disorders can make diabetes more difficult to manage. How to recognize stress You may have diabetes distress if you: Avoid or ignore your daily diabetes care. This includes glucose testing, following a meal plan, and taking medications. Feel overwhelmed by your daily diabetes care. Experience emotional reactions such as anger, sadness, or fear related to your daily diabetes care. Feel fear or shame about not doing everything perfectly that you have been told to do. Emotional distress Symptoms of diabetes distress include: Anger about having a diagnosis of diabetes. Fear or frustration about your diagnosis and the changes you need to make to manage the condition. Being overly worried about the care that you need or the cost of the care that you need. Feeling like you caused your condition by doing something wrong. Fear about unpredictable  fluctuations in your blood glucose, like low or high blood glucose. Feeling judged by your health care providers. Feeling very alone with the disease. Depression Having diabetes means that you are at a higher risk for depression. Your health care provider may test (screen) you for symptoms of depression. It is important to recognize symptoms and to start treatment for depression soon after it is diagnosed. The following are some symptoms of depression: Loss of interest in things that you used to enjoy. Feeling depressed much or most of the time. A change in appetite. Trouble getting to sleep or staying asleep. Feeling tired most of the day. Feeling nervous and anxious. Feeling guilty and worrying that you are a burden to others. Having thoughts of hurting yourself or feeling that you want to die. If you have any of these symptoms, more days than not, for 2 weeks or longer, you may have depression. This would be a good time to contact your health care provider. Follow these instructions at home: Managing diabetes distress The following are some ways to manage emotional distress: Learn as much as you can about diabetes and its treatment. Take one step at a time to improve your management. Meet with a certified diabetes care and education specialist. Take a class to learn how to manage your condition. Consider working with a counselor or therapist. Keep a journal of your thoughts and concerns. Accept that some things are out of your control. Talk with other people who have diabetes. It can help to talk about the distress that you feel. Find ways to manage stress that work for you. These may include art or music therapy, exercise, meditation, and hobbies. Seek support from spiritual leaders, family, and friends.    General instructions Do your best to follow your diabetes management plan. If you are struggling to follow your plan, talk with a certified diabetes care and education specialist, or  with someone else who has diabetes. They may have ideas that will help. Forgive yourself for not being perfect. Almost everyone struggles with the tasks of diabetes. Keep all follow-up visits. This is important. Where to find support Search for information and support from the American Diabetes Association: www.diabetes.org Find a certified diabetes education and care specialist. Make an appointment through the Association of Diabetes Care & Education Specialists: www.diabeteseducator.org Contact a health care provider if: You believe your diabetes is getting out of control. You are concerned you may be depressed. You think your medications are not helping control your diabetes. You are feeling overwhelmed with your diabetes. Get help right away if: You have thoughts about hurting yourself or others. If you ever feel like you may hurt yourself or others, or have thoughts about taking your own life, get help right away. You can go to your nearest emergency department or call: Your local emergency services (911 in the [place] A suicide crisis helpline, such as the National Suicide Prevention Lifeline at 1-800-273-8255 or 988 in the [place] This is open 24 hours a day. Summary Diabetes (type 1 diabetes mellitus or type 2 diabetes mellitus) is a condition in which the body does not have enough of a hormone called insulin, or the body does not respond properly to insulin. Living with diabetes puts you at risk for medical and emotional issues, such as diabetes distress, depression, and anxiety. Recognizing the symptoms of diabetes distress and depression may help you avoid problems with your diabetes control. If you experience symptoms, it is important to discuss this with your health care provider, certified diabetes care and education specialist, or therapist. It is important to start treatment for diabetes distress and depression soon after diagnosis. Ask your health care provider to recommend a  therapist who understands both depression and diabetes. This information is not intended to replace advice given to you by your health care provider. Make sure you discuss any questions you have with your health care provider. Document Revised: 05/29/2021 Document Reviewed: 03/15/2020 Elsevier Patient Education  2023 Elsevier Inc.  

## 2022-08-26 NOTE — Progress Notes (Signed)
Patient ID: Heather Roach, female    DOB: 23-Dec-1970  MRN: [id number]  CC: Blood Pressure Check  Subjective: Heather Roach is a 51 y.o. female who presents for blood pressure check.   Her concerns today include:  Taking blood pressure medications daily at bedtime. Recent sprain of left ankle. Endorses pain. Cam walker in place and was directed to follow-up with Ortho as needed at emergency department discharge on 09/01/2022. Considered if pain contributing to higher than normal blood pressures in the home setting which are similar to today's. Denies red flag symptoms.  Patient Active Problem List   Diagnosis Date Noted   Essential hypertension 02/21/2022   Tachycardia 02/21/2022   Elevated random blood glucose level 02/21/2022   Hypocalcemia 02/21/2022     Current Outpatient Medications on File Prior to Visit  Medication Sig Dispense Refill   Accu-Chek Softclix Lancets lancets Use as instructed 100 each 12   Blood Glucose Monitoring Suppl (ACCU-CHEK GUIDE) w/Device KIT 1 Units by Does not apply route daily. 1 kit 0   glucose blood (ACCU-CHEK GUIDE) test strip USE AS DIRECTED TWICE A DAY TO TEST BLOOD SUGARS 100 each 12   hydrochlorothiazide (HYDRODIURIL) 50 MG tablet Take 1 tablet (50 mg total) by mouth daily. 30 tablet 2   metFORMIN (GLUCOPHAGE-XR) 500 MG 24 hr tablet Take 1 tablet (500 mg total) by mouth daily with supper. 30 tablet 2   metoprolol succinate (TOPROL-XL) 25 MG 24 hr tablet Take 1 tablet (25 mg total) by mouth daily. 30 tablet 2   No current facility-administered medications on file prior to visit.    No Known Allergies  Social History   Socioeconomic History   Marital status: Married    Spouse name: Not on file   Number of children: Not on file   Years of education: Not on file   Highest education level: Not on file  Occupational History   Not on file  Tobacco Use   Smoking status: Never    Passive exposure: Never   Smokeless tobacco: Never  Substance  and Sexual Activity   Alcohol use: Not Currently   Drug use: Not Currently   Sexual activity: Yes  Other Topics Concern   Not on file  Social History Narrative   Not on file   Social Determinants of Health   Financial Resource Strain: Not on file  Food Insecurity: Not on file  Transportation Needs: Not on file  Physical Activity: Not on file  Stress: Not on file  Social Connections: Not on file  Intimate Partner Violence: Not on file    No family history on file.  No past surgical history on file.  ROS: Review of Systems Negative except as stated above  PHYSICAL EXAM: BP (!) 145/91 (BP Location: Right Arm, Patient Position: Sitting, Cuff Size: Large)   Pulse 84   Temp 98.3 F (36.8 C)   Resp 16   Ht _0  (1.651 m)   Wt 165 lb (74.8 kg)   SpO2 95%   BMI 27.46 kg/m   Physical Exam HENT:     Head: Normocephalic and atraumatic.  Eyes:     Extraocular Movements: Extraocular movements intact.     Conjunctiva/sclera: Conjunctivae normal.     Pupils: Pupils are equal, round, and reactive to light.  Cardiovascular:     Rate and Rhythm: Normal rate and regular rhythm.     Pulses: Normal pulses.     Heart sounds: Normal heart sounds.  Pulmonary:  Effort: Pulmonary effort is normal.     Breath sounds: Normal breath sounds.  Musculoskeletal:     Cervical back: Normal range of motion and neck supple.  Neurological:     General: No focal deficit present.     Mental Status: She is alert and oriented to person, place, and time.  Psychiatric:        Mood and Affect: Mood normal.        Behavior: Behavior normal.    ASSESSMENT AND PLAN: 1. Primary hypertension 2. Tachycardia - Blood pressure above goal today in office. Patient asymptomatic without red flag symptoms.  - Patient with recent injury to left ankle and experiencing pain which may be contributing to above goal blood pressure. However, patient reports her blood pressures were above goal at home prior to  fall on 09/01/2022. - Continue Hydrochlorothiazide and Metoprolol Succinate as prescribed. No refills needed as of present. - Begin Valsartan as prescribed. Counseled on medication adherence/adverse effects. - Counseled on blood pressure goal of less than 130/80, low-sodium, DASH diet, medication compliance, and 150 minutes of moderate intensity exercise per week as tolerated. Counseled on medication adherence and adverse effects. - Update BMP.  - Follow-up with primary provider in 2 weeks or sooner if needed for blood pressure check. - Basic Metabolic Panel - valsartan (DIOVAN) 40 MG tablet; Take 1 tablet (40 mg total) by mouth daily.  Dispense: 30 tablet; Refill: 2   Patient was given the opportunity to ask questions.  Patient verbalized understanding of the plan and was able to repeat key elements of the plan. Patient was given clear instructions to go to Emergency Department or return to medical center if symptoms don't improve, worsen, or new problems develop.The patient verbalized understanding.   Orders Placed This Encounter  Procedures   Basic Metabolic Panel     Requested Prescriptions   Signed Prescriptions Disp Refills   valsartan (DIOVAN) 40 MG tablet 30 tablet 2    Sig: Take 1 tablet (40 mg total) by mouth daily.    Return in about 2 weeks (around 09/19/2022) for Follow-Up or next available bp check.  [name], NP

## 2022-09-01 ENCOUNTER — Emergency Department (HOSPITAL_COMMUNITY): Payer: No Typology Code available for payment source

## 2022-09-01 ENCOUNTER — Other Ambulatory Visit: Payer: Self-pay

## 2022-09-01 ENCOUNTER — Emergency Department (HOSPITAL_COMMUNITY)
Admission: EM | Admit: 2022-09-01 | Discharge: 2022-09-01 | Disposition: A | Discharge status: Home / Self Care | Payer: No Typology Code available for payment source | Attending: Emergency Medicine | Admitting: Emergency Medicine

## 2022-09-01 ENCOUNTER — Encounter (HOSPITAL_COMMUNITY): Payer: Self-pay

## 2022-09-01 DIAGNOSIS — W010XXA Fall on same level from slipping, tripping and stumbling without subsequent striking against object, initial encounter: Secondary | ICD-10-CM | POA: Insufficient documentation

## 2022-09-01 DIAGNOSIS — Z79899 Other long term (current) drug therapy: Secondary | ICD-10-CM | POA: Insufficient documentation

## 2022-09-01 DIAGNOSIS — I10 Essential (primary) hypertension: Secondary | ICD-10-CM | POA: Insufficient documentation

## 2022-09-01 DIAGNOSIS — S93402A Sprain of unspecified ligament of left ankle, initial encounter: Secondary | ICD-10-CM

## 2022-09-01 DIAGNOSIS — Y92512 Supermarket, store or market as the place of occurrence of the external cause: Secondary | ICD-10-CM | POA: Insufficient documentation

## 2022-09-01 NOTE — ED Triage Notes (Signed)
Pt arrived from home via POV sp fall at grocery store where she slipped on something wet. Pt denies hitting her head during fall. Only noted injury is left ankle pain. 10/10 on pain scale.

## 2022-09-02 NOTE — ED Provider Notes (Signed)
[facility] EMERGENCY DEPARTMENT Provider Note   CSN: [id number] Arrival date & time: 09/01/22  1925     History  Chief Complaint  Patient presents with   Ankle Pain    Heather Roach is a 51 y.o. female.  The history is provided by the patient.  Ankle Pain Patient presents with left ankle injury.  Slipped in a grocery store.  Pain on the medial aspect of the left ankle.  Some pain with ambulation but has been able to ambulate.  No other injury.    Past Medical History:  Diagnosis Date   Hypertension     Home Medications Prior to Admission medications   Medication Sig Start Date End Date Taking? Authorizing Provider  Accu-Chek Softclix Lancets lancets Use as instructed 02/26/22   Mayers, Cari S, PA-C  Blood Glucose Monitoring Suppl (ACCU-CHEK GUIDE) w/Device KIT 1 Units by Does not apply route daily. 02/26/22   Mayers, Cari S, PA-C  glucose blood (ACCU-CHEK GUIDE) test strip USE AS DIRECTED TWICE A DAY TO TEST BLOOD SUGARS 02/26/22   Mayers, Cari S, PA-C  hydrochlorothiazide (HYDRODIURIL) 50 MG tablet Take 1 tablet (50 mg total) by mouth daily. 08/22/22 11/20/22  [name], NP  metFORMIN (GLUCOPHAGE-XR) 500 MG 24 hr tablet Take 1 tablet (500 mg total) by mouth daily with supper. 08/22/22 11/20/22  [name], NP  metoprolol succinate (TOPROL-XL) 25 MG 24 hr tablet Take 1 tablet (25 mg total) by mouth daily. 08/22/22 11/20/22  [name], NP      Allergies    Patient has no known allergies.    Review of Systems   Review of Systems  Physical Exam Updated Vital Signs BP (!) 169/123   Pulse 91   Temp 98.1 F (36.7 C) (Oral)   Resp 17   Ht $R'5\' 5"'qF$  (1.651 m)   Wt 77.1 kg   SpO2 100%   BMI 28.29 kg/m  Physical Exam Vitals and nursing note reviewed.  Musculoskeletal:     Cervical back: Neck supple.     Comments: Tenderness to left ankle medially and just anterior to the medial malleolus.  Skin intact.  No foot tenderness.  No proximal tenderness  Skin:     Capillary Refill: Capillary refill takes less than 2 seconds.  Neurological:     Mental Status: She is alert and oriented to person, place, and time.     ED Results / Procedures / Treatments   Labs (all labs ordered are listed, but only abnormal results are displayed) Labs Reviewed - No data to display  EKG None  Radiology DG Ankle Complete Left  Result Date: 09/01/2022 CLINICAL DATA:  Fall. EXAM: LEFT ANKLE COMPLETE - 3+ VIEW COMPARISON:  None Available. FINDINGS: There is no evidence of fracture, dislocation, or joint effusion. There is no evidence of arthropathy or other focal bone abnormality. Soft tissues are unremarkable. IMPRESSION: Negative. Electronically Signed   By: Keane Police D.O.   On: 09/01/2022 20:27    Procedures Procedures    Medications Ordered in ED Medications - No data to display  ED Course/ Medical Decision Making/ A&P                           Medical Decision Making Amount and/or Complexity of Data Reviewed Radiology: ordered.   Patient with ankle sprain.  Twist.  Negative x-ray.  Discussed with patient about ASO versus cam walker.  With pain and discussed with patient  we will do a cam walker.  Ortho follow-up as needed.  No other apparent injury.  However does have hypertension.  History of hypertension.  Potentially could be related to pain.  Discussed with patient she will follow-up short-term for recheck.        Final Clinical Impression(s) / ED Diagnoses Final diagnoses:  Sprain of left ankle, unspecified ligament, initial encounter    Rx / DC Orders ED Discharge Orders     None         Davonna Belling, MD 09/02/22 1554

## 2022-09-05 ENCOUNTER — Ambulatory Visit (INDEPENDENT_AMBULATORY_CARE_PROVIDER_SITE_OTHER): Payer: Self-pay | Admitting: Family

## 2022-09-05 VITALS — BP 145/91 | HR 84 | Temp 98.3°F | Resp 16 | Ht 65.0 in | Wt 165.0 lb

## 2022-09-05 DIAGNOSIS — I10 Essential (primary) hypertension: Secondary | ICD-10-CM

## 2022-09-05 DIAGNOSIS — R000 Tachycardia, unspecified: Secondary | ICD-10-CM

## 2022-09-05 MED ORDER — VALSARTAN 40 MG PO TABS: 40.0000 mg | ORAL_TABLET | Freq: Every day | ORAL | 2 refills | Status: DC

## 2022-09-05 NOTE — Progress Notes (Signed)
.  Pt presents for blood pressure check   

## 2022-09-06 LAB — BASIC METABOLIC PANEL
BUN/Creatinine Ratio: 15 (ref 9–23)
BUN: 15 mg/dL (ref 6–24)
CO2: 23 mmol/L (ref 20–29)
Calcium: 9.4 mg/dL (ref 8.7–10.2)
Chloride: 101 mmol/L (ref 96–106)
Creatinine, Ser: 1.02 mg/dL — ABNORMAL HIGH (ref 0.57–1.00)
Glucose: 139 mg/dL — ABNORMAL HIGH (ref 70–99)
Potassium: 4.6 mmol/L (ref 3.5–5.2)
Sodium: 138 mmol/L (ref 134–144)
eGFR: 67 mL/min/{1.73_m2} (ref >59)

## 2022-09-11 NOTE — Progress Notes (Signed)
Patient ID: Heather Roach, female    DOB: [birth year]  MRN: [id number]  CC: Blood Pressure Check  Subjective: Heather Roach is a 51 y.o. female who presents for blood pressure check.   Her concerns today include:  Reports since last visit did not begin Valsartan due to being out of town caring for her sick daughter. States she plans to pickup Valsartan from pharmacy on today. Denies red flag symptoms. No further issues/concerns.   Patient Active Problem List   Diagnosis Date Noted   Essential hypertension 02/21/2022   Tachycardia 02/21/2022   Elevated random blood glucose level 02/21/2022   Hypocalcemia 02/21/2022     Current Outpatient Medications on File Prior to Visit  Medication Sig Dispense Refill   Accu-Chek Softclix Lancets lancets Use as instructed 100 each 12   Blood Glucose Monitoring Suppl (ACCU-CHEK GUIDE) w/Device KIT 1 Units by Does not apply route daily. 1 kit 0   glucose blood (ACCU-CHEK GUIDE) test strip USE AS DIRECTED TWICE A DAY TO TEST BLOOD SUGARS 100 each 12   hydrochlorothiazide (HYDRODIURIL) 50 MG tablet Take 1 tablet (50 mg total) by mouth daily. 30 tablet 2   metFORMIN (GLUCOPHAGE-XR) 500 MG 24 hr tablet Take 1 tablet (500 mg total) by mouth daily with supper. 30 tablet 2   metoprolol succinate (TOPROL-XL) 25 MG 24 hr tablet Take 1 tablet (25 mg total) by mouth daily. 30 tablet 2   valsartan (DIOVAN) 40 MG tablet Take 1 tablet (40 mg total) by mouth daily. 30 tablet 2   No current facility-administered medications on file prior to visit.    No Known Allergies  Social History   Socioeconomic History   Marital status: Married    Spouse name: Not on file   Number of children: Not on file   Years of education: Not on file   Highest education level: Not on file  Occupational History   Not on file  Tobacco Use   Smoking status: Never    Passive exposure: Never   Smokeless tobacco: Never  Substance and Sexual Activity   Alcohol use: Not Currently    Drug use: Not Currently   Sexual activity: Yes  Other Topics Concern   Not on file  Social History Narrative   Not on file   Social Determinants of Health   Financial Resource Strain: Not on file  Food Insecurity: Not on file  Transportation Needs: Not on file  Physical Activity: Not on file  Stress: Not on file  Social Connections: Not on file  Intimate Partner Violence: Not on file    No family history on file.  No past surgical history on file.  ROS: Review of Systems Negative except as stated above  PHYSICAL EXAM: BP (!) 159/97 (BP Location: Right Arm, Patient Position: Sitting, Cuff Size: Normal)   Pulse 78   Temp 98.3 F (36.8 C)   Resp 16   Ht $R'5\' 5"'es$  (1.651 m)   Wt 171 lb (77.6 kg)   SpO2 98%   BMI 28.46 kg/m   Physical Exam HENT:     Head: Normocephalic and atraumatic.  Eyes:     Extraocular Movements: Extraocular movements intact.     Conjunctiva/sclera: Conjunctivae normal.     Pupils: Pupils are equal, round, and reactive to light.  Cardiovascular:     Rate and Rhythm: Normal rate and regular rhythm.     Pulses: Normal pulses.     Heart sounds: Normal heart sounds.  Pulmonary:  Effort: Pulmonary effort is normal.     Breath sounds: Normal breath sounds.  Musculoskeletal:     Cervical back: Normal range of motion and neck supple.  Neurological:     General: No focal deficit present.     Mental Status: She is alert and oriented to person, place, and time.  Psychiatric:        Mood and Affect: Mood normal.        Behavior: Behavior normal.    ASSESSMENT AND PLAN: 1. Primary hypertension 2. Tachycardia - Blood pressure not at goal during today's visit. Patient asymptomatic without chest pressure, chest pain, palpitations, shortness of breath, worst headache of life, and any additional red flag symptoms. - Patient reports since last visit she did not begin Valsartan due to being out of town and states she will pickup from pharmacy on today.   - Continue Hydrochlorothiazide and Metoprolol as prescribed. No refills needed as of present.  - Counseled on blood pressure goal of less than 130/80, low-sodium, DASH diet, medication compliance, and 150 minutes of moderate intensity exercise per week as tolerated. Counseled on medication adherence and adverse effects. - Follow-up with primary provider in 2 weeks or sooner if needed for blood pressure check.   Patient was given the opportunity to ask questions.  Patient verbalized understanding of the plan and was able to repeat key elements of the plan. Patient was given clear instructions to go to Emergency Department or return to medical center if symptoms don't improve, worsen, or new problems develop.The patient verbalized understanding.     Return in about 2 weeks (around 10/02/2022) for Follow-Up or next available blood pressure check.  [name], NP

## 2022-09-18 ENCOUNTER — Ambulatory Visit (INDEPENDENT_AMBULATORY_CARE_PROVIDER_SITE_OTHER): Payer: Self-pay | Admitting: Family

## 2022-09-18 ENCOUNTER — Encounter: Payer: Self-pay | Admitting: Family

## 2022-09-18 VITALS — BP 159/97 | HR 78 | Temp 98.3°F | Resp 16 | Ht 65.0 in | Wt 171.0 lb

## 2022-09-18 DIAGNOSIS — R000 Tachycardia, unspecified: Secondary | ICD-10-CM

## 2022-09-18 DIAGNOSIS — I10 Essential (primary) hypertension: Secondary | ICD-10-CM

## 2022-09-18 NOTE — Progress Notes (Signed)
.  Pt presents for blood pressure check   -pt states she has not been able to start Valsartan just got back from [facility] dealing w/daughter

## 2022-09-23 NOTE — Progress Notes (Signed)
Patient ID: Heather Roach, female    DOB: 1971/07/18  MRN: [id number]  CC: Blood Pressure Check  Subjective: Heather Roach is a 51 y.o. female who presents for blood pressure check. She is accompanied by her spouse Heather Roach.   Her concerns today include:  Taking blood pressure medications as prescribed. Denies red flag symptoms. No issues/concerns today.    Patient Active Problem List   Diagnosis Date Noted   Essential hypertension 02/21/2022   Tachycardia 02/21/2022   Elevated random blood glucose level 02/21/2022   Hypocalcemia 02/21/2022     Current Outpatient Medications on File Prior to Visit  Medication Sig Dispense Refill   Accu-Chek Softclix Lancets lancets Use as instructed 100 each 12   Blood Glucose Monitoring Suppl (ACCU-CHEK GUIDE) w/Device KIT 1 Units by Does not apply route daily. 1 kit 0   glucose blood (ACCU-CHEK GUIDE) test strip USE AS DIRECTED TWICE A DAY TO TEST BLOOD SUGARS 100 each 12   hydrochlorothiazide (HYDRODIURIL) 50 MG tablet Take 1 tablet (50 mg total) by mouth daily. 30 tablet 2   metFORMIN (GLUCOPHAGE-XR) 500 MG 24 hr tablet Take 1 tablet (500 mg total) by mouth daily with supper. 30 tablet 2   metoprolol succinate (TOPROL-XL) 25 MG 24 hr tablet Take 1 tablet (25 mg total) by mouth daily. 30 tablet 2   No current facility-administered medications on file prior to visit.    No Known Allergies  Social History   Socioeconomic History   Marital status: Married    Spouse name: Not on file   Number of children: Not on file   Years of education: Not on file   Highest education level: Not on file  Occupational History   Not on file  Tobacco Use   Smoking status: Never    Passive exposure: Never   Smokeless tobacco: Never  Substance and Sexual Activity   Alcohol use: Not Currently   Drug use: Not Currently   Sexual activity: Yes  Other Topics Concern   Not on file  Social History Narrative   Not on file   Social Determinants of Health    Financial Resource Strain: Not on file  Food Insecurity: Not on file  Transportation Needs: Not on file  Physical Activity: Not on file  Stress: Not on file  Social Connections: Not on file  Intimate Partner Violence: Not on file    No family history on file.  No past surgical history on file.  ROS: Review of Systems Negative except as stated above  PHYSICAL EXAM: BP (!) 158/100 (BP Location: Right Arm, Patient Position: Sitting, Cuff Size: Large)   Physical Exam HENT:     Head: Normocephalic and atraumatic.  Eyes:     Extraocular Movements: Extraocular movements intact.     Conjunctiva/sclera: Conjunctivae normal.     Pupils: Pupils are equal, round, and reactive to light.  Cardiovascular:     Rate and Rhythm: Normal rate and regular rhythm.     Pulses: Normal pulses.     Heart sounds: Normal heart sounds.  Pulmonary:     Effort: Pulmonary effort is normal.     Breath sounds: Normal breath sounds.  Musculoskeletal:     Cervical back: Normal range of motion and neck supple.  Neurological:     General: No focal deficit present.     Mental Status: She is alert and oriented to person, place, and time.  Psychiatric:        Mood and Affect: Mood normal.  Behavior: Behavior normal.      ASSESSMENT AND PLAN: 1. Primary hypertension 2. Tachycardia - Blood pressure not at goal during today's visit. Patient asymptomatic without chest pressure, chest pain, palpitations, shortness of breath, worst headache of life, and any additional red flag symptoms. - Continue Hydrochlorothiazide and Metoprolol Succinate as prescribed. No refills needed as of present.  - Increase Valsartan from 40 mg to 80 mg daily. Counseled on medication adherence.  - Update BMP.  - Referral to Cardiology for further evaluation and management. During the interim patient will follow-up with me in 2 weeks or sooner if needed until established with Cardiology.  - Basic Metabolic Panel -  valsartan (DIOVAN) 80 MG tablet; Take 1 tablet (80 mg total) by mouth daily.  Dispense: 30 tablet; Refill: 2 - Ambulatory referral to Cardiology    Patient was given the opportunity to ask questions.  Patient verbalized understanding of the plan and was able to repeat key elements of the plan. Patient was given clear instructions to go to Emergency Department or return to medical center if symptoms don't improve, worsen, or new problems develop.The patient verbalized understanding.   Orders Placed This Encounter  Procedures   Basic Metabolic Panel   Ambulatory referral to Cardiology     Requested Prescriptions   Signed Prescriptions Disp Refills   valsartan (DIOVAN) 80 MG tablet 30 tablet 2    Sig: Take 1 tablet (80 mg total) by mouth daily.    Follow-up as needed with primary care provider.   [name], NP

## 2022-10-02 ENCOUNTER — Ambulatory Visit (INDEPENDENT_AMBULATORY_CARE_PROVIDER_SITE_OTHER): Payer: Self-pay | Admitting: Family

## 2022-10-02 VITALS — BP 158/100

## 2022-10-02 DIAGNOSIS — R000 Tachycardia, unspecified: Secondary | ICD-10-CM

## 2022-10-02 DIAGNOSIS — I10 Essential (primary) hypertension: Secondary | ICD-10-CM

## 2022-10-02 MED ORDER — VALSARTAN 80 MG PO TABS: 80.0000 mg | ORAL_TABLET | Freq: Every day | ORAL | 2 refills | Status: DC

## 2022-10-02 NOTE — Patient Instructions (Signed)
Hypertension, Adult High blood pressure (hypertension) is when the force of blood pumping through the arteries is too strong. The arteries are the blood vessels that carry blood from the heart throughout the body. Hypertension forces the heart to work harder to pump blood and may cause arteries to become narrow or stiff. Untreated or uncontrolled hypertension can lead to a heart attack, heart failure, a stroke, kidney disease, and other problems. A blood pressure reading consists of a higher number over a lower number. Ideally, your blood pressure should be below 120/80. The first ("top") number is called the systolic pressure. It is a measure of the pressure in your arteries as your heart beats. The second ("bottom") number is called the diastolic pressure. It is a measure of the pressure in your arteries as the heart relaxes. What are the causes? The exact cause of this condition is not known. There are some conditions that result in high blood pressure. What increases the risk? Certain factors may make you more likely to develop high blood pressure. Some of these risk factors are under your control, including: Smoking. Not getting enough exercise or physical activity. Being overweight. Having too much fat, sugar, calories, or salt (sodium) in your diet. Drinking too much alcohol. Other risk factors include: Having a personal history of heart disease, diabetes, high cholesterol, or kidney disease. Stress. Having a family history of high blood pressure and high cholesterol. Having obstructive sleep apnea. Age. The risk increases with age. What are the signs or symptoms? High blood pressure may not cause symptoms. Very high blood pressure (hypertensive crisis) may cause: Headache. Fast or irregular heartbeats (palpitations). Shortness of breath. Nosebleed. Nausea and vomiting. Vision changes. Severe chest pain, dizziness, and seizures. How is this diagnosed? This condition is diagnosed by  measuring your blood pressure while you are seated, with your arm resting on a flat surface, your legs uncrossed, and your feet flat on the floor. The cuff of the blood pressure monitor will be placed directly against the skin of your upper arm at the level of your heart. Blood pressure should be measured at least twice using the same arm. Certain conditions can cause a difference in blood pressure between your right and left arms. If you have a high blood pressure reading during one visit or you have normal blood pressure with other risk factors, you may be asked to: Return on a different day to have your blood pressure checked again. Monitor your blood pressure at home for 1 week or longer. If you are diagnosed with hypertension, you may have other blood or imaging tests to help your health care provider understand your overall risk for other conditions. How is this treated? This condition is treated by making healthy lifestyle changes, such as eating healthy foods, exercising more, and reducing your alcohol intake. You may be referred for counseling on a healthy diet and physical activity. Your health care provider may prescribe medicine if lifestyle changes are not enough to get your blood pressure under control and if: Your systolic blood pressure is above 130. Your diastolic blood pressure is above 80. Your personal target blood pressure may vary depending on your medical conditions, your age, and other factors. Follow these instructions at home: Eating and drinking  Eat a diet that is high in fiber and potassium, and low in sodium, added sugar, and fat. An example of this eating plan is called the DASH diet. DASH stands for Dietary Approaches to Stop Hypertension. To eat this way: Eat   plenty of fresh fruits and vegetables. Try to fill one half of your plate at each meal with fruits and vegetables. Eat whole grains, such as whole-wheat pasta, brown rice, or whole-grain bread. Fill about one  fourth of your plate with whole grains. Eat or drink low-fat dairy products, such as skim milk or low-fat yogurt. Avoid fatty cuts of meat, processed or cured meats, and poultry with skin. Fill about one fourth of your plate with lean proteins, such as fish, chicken without skin, beans, eggs, or tofu. Avoid pre-made and processed foods. These tend to be higher in sodium, added sugar, and fat. Reduce your daily sodium intake. Many people with hypertension should eat less than 1,500 mg of sodium a day. Do not drink alcohol if: Your health care provider tells you not to drink. You are pregnant, may be pregnant, or are planning to become pregnant. If you drink alcohol: Limit how much you have to: 0-1 drink a day for women. 0-2 drinks a day for men. Know how much alcohol is in your drink. In the [place] one drink equals one 12 oz bottle of beer (355 mL), one 5 oz glass of wine (148 mL), or one 1 oz glass of hard liquor (44 mL). Lifestyle  Work with your health care provider to maintain a healthy body weight or to lose weight. Ask what an ideal weight is for you. Get at least 30 minutes of exercise that causes your heart to beat faster (aerobic exercise) most days of the week. Activities may include walking, swimming, or biking. Include exercise to strengthen your muscles (resistance exercise), such as Pilates or lifting weights, as part of your weekly exercise routine. Try to do these types of exercises for 30 minutes at least 3 days a week. Do not use any products that contain nicotine or tobacco. These products include cigarettes, chewing tobacco, and vaping devices, such as e-cigarettes. If you need help quitting, ask your health care provider. Monitor your blood pressure at home as told by your health care provider. Keep all follow-up visits. This is important. Medicines Take over-the-counter and prescription medicines only as told by your health care provider. Follow directions carefully. Blood  pressure medicines must be taken as prescribed. Do not skip doses of blood pressure medicine. Doing this puts you at risk for problems and can make the medicine less effective. Ask your health care provider about side effects or reactions to medicines that you should watch for. Contact a health care provider if you: Think you are having a reaction to a medicine you are taking. Have headaches that keep coming back (recurring). Feel dizzy. Have swelling in your ankles. Have trouble with your vision. Get help right away if you: Develop a severe headache or confusion. Have unusual weakness or numbness. Feel faint. Have severe pain in your chest or abdomen. Vomit repeatedly. Have trouble breathing. These symptoms may be an emergency. Get help right away. Call 911. Do not wait to see if the symptoms will go away. Do not drive yourself to the hospital. Summary Hypertension is when the force of blood pumping through your arteries is too strong. If this condition is not controlled, it may put you at risk for serious complications. Your personal target blood pressure may vary depending on your medical conditions, your age, and other factors. For most people, a normal blood pressure is less than 120/80. Hypertension is treated with lifestyle changes, medicines, or a combination of both. Lifestyle changes include losing weight, eating a healthy,   low-sodium diet, exercising more, and limiting alcohol. This information is not intended to replace advice given to you by your health care provider. Make sure you discuss any questions you have with your health care provider. Document Revised: 09/10/2021 Document Reviewed: 09/10/2021 Elsevier Patient Education  2023 [employer] Inc[employer]  

## 2022-10-02 NOTE — Progress Notes (Signed)
.  Pt presents for blood pressure check   

## 2022-10-03 ENCOUNTER — Encounter: Payer: Self-pay | Admitting: Emergency Medicine

## 2022-10-03 ENCOUNTER — Ambulatory Visit
Admission: EM | Admit: 2022-10-03 | Discharge: 2022-10-03 | Disposition: A | Discharge status: Home / Self Care | Payer: No Typology Code available for payment source | Attending: Emergency Medicine | Admitting: Emergency Medicine

## 2022-10-03 DIAGNOSIS — R519 Headache, unspecified: Secondary | ICD-10-CM | POA: Insufficient documentation

## 2022-10-03 DIAGNOSIS — I10 Essential (primary) hypertension: Secondary | ICD-10-CM | POA: Insufficient documentation

## 2022-10-03 LAB — URINALYSIS, ROUTINE W REFLEX MICROSCOPIC
Bilirubin Urine: NEGATIVE
Glucose, UA: NEGATIVE mg/dL
Ketones, ur: NEGATIVE mg/dL
Leukocytes,Ua: NEGATIVE
Nitrite: NEGATIVE
Protein, ur: NEGATIVE mg/dL
Specific Gravity, Urine: 1.025 (ref 1.005–1.030)
pH: 6 (ref 5.0–8.0)

## 2022-10-03 LAB — CBC WITH DIFFERENTIAL/PLATELET
Abs Immature Granulocytes: 0.03 10*3/uL (ref 0.00–0.07)
Basophils Absolute: 0.1 10*3/uL (ref 0.0–0.1)
Basophils Relative: 1 %
Eosinophils Absolute: 0.2 10*3/uL (ref 0.0–0.5)
Eosinophils Relative: 3 %
HCT: 38.2 % (ref 36.0–46.0)
Hemoglobin: 12.8 g/dL (ref 12.0–15.0)
Immature Granulocytes: 0 %
Lymphocytes Relative: 37 %
Lymphs Abs: 2.9 10*3/uL (ref 0.7–4.0)
MCH: 28.7 pg (ref 26.0–34.0)
MCHC: 33.5 g/dL (ref 30.0–36.0)
MCV: 85.7 fL (ref 80.0–100.0)
Monocytes Absolute: 0.9 10*3/uL (ref 0.1–1.0)
Monocytes Relative: 11 %
Neutro Abs: 3.8 10*3/uL (ref 1.7–7.7)
Neutrophils Relative %: 48 %
Platelets: 291 10*3/uL (ref 150–400)
RBC: 4.46 MIL/uL (ref 3.87–5.11)
RDW: 13.3 % (ref 11.5–15.5)
WBC: 7.8 10*3/uL (ref 4.0–10.5)
nRBC: 0 % (ref 0.0–0.2)

## 2022-10-03 LAB — BASIC METABOLIC PANEL
Anion gap: 2 — ABNORMAL LOW (ref 5–15)
BUN: 12 mg/dL (ref 6–20)
CO2: 29 mmol/L (ref 22–32)
Calcium: 9.6 mg/dL (ref 8.9–10.3)
Chloride: 102 mmol/L (ref 98–111)
Creatinine, Ser: 0.99 mg/dL (ref 0.44–1.00)
GFR, Estimated: >60 mL/min (ref >60)
Glucose, Bld: 117 mg/dL — ABNORMAL HIGH (ref 70–99)
Potassium: 3.8 mmol/L (ref 3.5–5.1)
Sodium: 133 mmol/L — ABNORMAL LOW (ref 135–145)

## 2022-10-03 LAB — GLUCOSE, CAPILLARY: Glucose-Capillary: 121 mg/dL — ABNORMAL HIGH (ref 70–99)

## 2022-10-03 LAB — URINALYSIS, MICROSCOPIC (REFLEX)

## 2022-10-03 MED ORDER — IBUPROFEN 600 MG PO TABS
600.0000 mg | ORAL_TABLET | Freq: Once | ORAL | Status: AC
  Administered 2022-10-03: 600 mg via ORAL

## 2022-10-03 MED ORDER — ACETAMINOPHEN 500 MG PO TABS
1000.0000 mg | ORAL_TABLET | Freq: Once | ORAL | Status: AC
  Administered 2022-10-03: 1000 mg via ORAL

## 2022-10-03 MED ORDER — ACETAMINOPHEN 500 MG PO TABS: 500.0000 mg | ORAL_TABLET | Freq: Once | ORAL | Status: DC

## 2022-10-03 NOTE — ED Provider Notes (Addendum)
HPI  SUBJECTIVE:  Heather Roach is a 51 y.o. female who presents with sensation of feeling "strange" starting around 930 this morning.  She is unable to characterize it.  She reports nausea, hand tremor, and a gradual onset headache accompanied with mild photophobia.  She states this is similar to previous headaches.  No diaphoresis, chest pressure or heaviness, blurry vision, slurred speech, arm or leg weakness, dyscoordination.  This is not the worst headache she has ever had.  Denies neck stiffness.  No chest pain, palpitations, shortness of breath, pain tearing through to her back, seizures, syncope, lower extremity edema, anuria, hematuria, abdominal pain.  No caffeine this morning.  No recent decongestant use.  She does not eat breakfast, but this is normal.  She is otherwise eating well.  She reports decreasing sodium intake recently.  No aggravating or alleviating factors for her symptoms today.  She has not tried anything for this.  Her blood pressure at home normally ranges in the high 160s to 170s/110-128  Patient was seen by her PCP on 11/2 for hypertension, where she was started on valsartan..  She is also on HCTZ and metoprolol.  She was reevaluated by PCP yesterday, blood pressure was 158/100, although she was asymptomatic.  Valsartan was increased to 80 mg, and she was referred to cardiology.  States that she took her first dose of valsartan 80 mg last night.  She is compliant with her hydrochlorothiazide and metoprolol.  She has a past medical history of headaches, hypertension, borderline diabetes.  No history of stroke, MI, hypercholesterolemia, PAD/PVD.  Family history negative for early MI.  Mother had a stroke at age 91.  LMP: Last week.  Denies the possibility being pregnant.  PCP: [facility]  Past Medical History:  Diagnosis Date   Hypertension     History reviewed. No pertinent surgical history.  History reviewed. No pertinent family history.  Social History    Tobacco Use   Smoking status: Never    Passive exposure: Never   Smokeless tobacco: Never  Vaping Use   Vaping Use: Never used  Substance Use Topics   Alcohol use: Not Currently   Drug use: Not Currently    No current facility-administered medications for this encounter.  Current Outpatient Medications:    hydrochlorothiazide (HYDRODIURIL) 50 MG tablet, Take 1 tablet (50 mg total) by mouth daily., Disp: 30 tablet, Rfl: 2   metoprolol succinate (TOPROL-XL) 25 MG 24 hr tablet, Take 1 tablet (25 mg total) by mouth daily., Disp: 30 tablet, Rfl: 2   valsartan (DIOVAN) 80 MG tablet, Take 1 tablet (80 mg total) by mouth daily., Disp: 30 tablet, Rfl: 2   Accu-Chek Softclix Lancets lancets, Use as instructed, Disp: 100 each, Rfl: 12   Blood Glucose Monitoring Suppl (ACCU-CHEK GUIDE) w/Device KIT, 1 Units by Does not apply route daily., Disp: 1 kit, Rfl: 0   glucose blood (ACCU-CHEK GUIDE) test strip, USE AS DIRECTED TWICE A DAY TO TEST BLOOD SUGARS, Disp: 100 each, Rfl: 12   metFORMIN (GLUCOPHAGE-XR) 500 MG 24 hr tablet, Take 1 tablet (500 mg total) by mouth daily with supper., Disp: 30 tablet, Rfl: 2  No Known Allergies   ROS  As noted in HPI.   Physical Exam  BP (S) (!) 182/130 (BP Location: Left Arm) Comment: Dr. Alphonzo Cruise notified.  Pulse 62   Temp 98.2 F (36.8 C) (Oral)   Resp 14   Ht _0  (1.651 m)   Wt 74.8 kg   LMP  09/22/2022 (Approximate)   SpO2 99%   BMI 27.46 kg/m  BP Readings from Last 3 Encounters:  10/03/22 (S) (!) 182/130  10/02/22 (!) 158/100  09/18/22 (!) 159/97    Constitutional: Well developed, well nourished, no acute distress Eyes:  EOMI, conjunctiva normal bilaterally PERRLA.  No photophobia. HENT: Normocephalic, atraumatic,mucus membranes moist Respiratory: Normal inspiratory effort, lungs clear bilaterally Cardiovascular: Normal rate, regular rhythm, no murmurs, rubs, gallops GI: nondistended skin: No rash, skin intact Musculoskeletal: no  deformities, no edema Neurologic: Alert & oriented x 3, cranial nerves III through XII intact, finger-nose, heel shin within normal limits.  Tandem gait steady.  Romberg negative. Psychiatric: Speech and behavior appropriate   ED Course   Medications  acetaminophen (TYLENOL) tablet 1,000 mg (1,000 mg Oral Given 10/03/22 1447)  ibuprofen (ADVIL) tablet 600 mg (600 mg Oral Given 10/03/22 1450)    Orders Placed This Encounter  Procedures   Glucose, capillary    Standing Status:   Standing    Number of Occurrences:   1   Basic metabolic panel    Standing Status:   Standing    Number of Occurrences:   1   Urinalysis, Routine w reflex microscopic Urine, Clean Catch    Standing Status:   Standing    Number of Occurrences:   1   CBC with Differential    Standing Status:   Standing    Number of Occurrences:   1   Urinalysis, Microscopic (reflex)    Standing Status:   Standing    Number of Occurrences:   1   Recheck vitals    Repeat BP in 15-30 minutes    Standing Status:   Standing    Number of Occurrences:   1   ED EKG    Hypertension    Standing Status:   Standing    Number of Occurrences:   1    Order Specific Question:   Reason for Exam    Answer:   Other (See Comments)   EKG 12-Lead    Standing Status:   Standing    Number of Occurrences:   1    Results for orders placed or performed during the hospital encounter of 10/03/22 (from the past 24 hour(s))  Glucose, capillary     Status: Abnormal   Collection Time: 10/03/22  2:07 PM  Result Value Ref Range   Glucose-Capillary 121 (H) 70 - 99 mg/dL  Basic metabolic panel     Status: Abnormal   Collection Time: 10/03/22  2:50 PM  Result Value Ref Range   Sodium 133 (L) 135 - 145 mmol/L   Potassium 3.8 3.5 - 5.1 mmol/L   Chloride 102 98 - 111 mmol/L   CO2 29 22 - 32 mmol/L   Glucose, Bld 117 (H) 70 - 99 mg/dL   BUN 12 6 - 20 mg/dL   Creatinine, Ser 0.99 0.44 - 1.00 mg/dL   Calcium 9.6 8.9 - 10.3 mg/dL   GFR, Estimated  >60 >60 mL/min   Anion gap 2 (L) 5 - 15  Urinalysis, Routine w reflex microscopic Urine, Clean Catch     Status: Abnormal   Collection Time: 10/03/22  2:50 PM  Result Value Ref Range   Color, Urine YELLOW YELLOW   APPearance CLEAR CLEAR   Specific Gravity, Urine 1.025 1.005 - 1.030   pH 6.0 5.0 - 8.0   Glucose, UA NEGATIVE NEGATIVE mg/dL   Hgb urine dipstick TRACE (A) NEGATIVE   Bilirubin Urine NEGATIVE NEGATIVE  Ketones, ur NEGATIVE NEGATIVE mg/dL   Protein, ur NEGATIVE NEGATIVE mg/dL   Nitrite NEGATIVE NEGATIVE   Leukocytes,Ua NEGATIVE NEGATIVE  CBC with Differential     Status: None   Collection Time: 10/03/22  2:50 PM  Result Value Ref Range   WBC 7.8 4.0 - 10.5 K/uL   RBC 4.46 3.87 - 5.11 MIL/uL   Hemoglobin 12.8 12.0 - 15.0 g/dL   HCT 38.2 36.0 - 46.0 %   MCV 85.7 80.0 - 100.0 fL   MCH 28.7 26.0 - 34.0 pg   MCHC 33.5 30.0 - 36.0 g/dL   RDW 13.3 11.5 - 15.5 %   Platelets 291 150 - 400 K/uL   nRBC 0.0 0.0 - 0.2 %   Neutrophils Relative % 48 %   Neutro Abs 3.8 1.7 - 7.7 K/uL   Lymphocytes Relative 37 %   Lymphs Abs 2.9 0.7 - 4.0 K/uL   Monocytes Relative 11 %   Monocytes Absolute 0.9 0.1 - 1.0 K/uL   Eosinophils Relative 3 %   Eosinophils Absolute 0.2 0.0 - 0.5 K/uL   Basophils Relative 1 %   Basophils Absolute 0.1 0.0 - 0.1 K/uL   Immature Granulocytes 0 %   Abs Immature Granulocytes 0.03 0.00 - 0.07 K/uL  Urinalysis, Microscopic (reflex)     Status: Abnormal   Collection Time: 10/03/22  2:50 PM  Result Value Ref Range   RBC / HPF 0-5 0 - 5 RBC/hpf   WBC, UA 0-5 0 - 5 WBC/hpf   Bacteria, UA FEW (A) NONE SEEN   Squamous Epithelial / LPF 0-5 0 - 5   No results found.  ED Clinical Impression  1. Acute nonintractable headache, unspecified headache type   2. Hypertension, unspecified type      ED Assessment/Plan      Outside records reviewed.  As noted in HPI.  Patient's blood pressure seems to be in the range as it is not at home.  She is reporting a  mild headache, but she is not encephalopathic.  No evidence of acute intracranial hemorrhage.  Fingerstick here 121.  Checking EKG, BMP CBC, UA.  Will give Tylenol and ibuprofen for her headache and reevaluate.  EKG: Normal sinus rhythm, rate 74.  Normal axis, normal intervals.  No hypertrophy.  No ST-T wave changes.  Symptomatic while the EKG was obtained.  UA negative for proteinuria, normal platelets, creatinine is normal.  EKG normal.  On reevaluation, patient states her headache is getting worse.  Manual blood pressure 170/115, which is her baseline at home.  However, she has no neurologic deficits.  I gave her the option to go to the emergency department for CT head versus giving her some dexamethasone and Zofran to see if we can break the headache.  She would like to go home and take her blood pressure medications.  She will call here an hour after taking them, let us know how she is doing and tell us what her blood pressure is.  Hypertensive emergency return precautions given.  1834.  Staff contacted patient to see how she was doing and to get repeat blood pressure.  She states that she is at work, but that her headache has "lightened up".  She is on her way home to take her blood pressure medications.  2031.  Patient called here.  Repeat blood pressure 181/90.  She took her medications an hour ago.  Headache has resolved.  Blood pressure has not particularly changed.  Advised her to call  her PCP in the morning and let them know that she was here with a headache and elevated blood pressure, and that the increased valsartan has not made much of a difference.  Discussed with her that she may need to be started on another medication.  ER return precautions given.  Discussed labs, MDM, treatment plan, and plan for follow-up with patient. Discussed sn/sx that should prompt return to the ED. patient agrees with plan.   Meds ordered this encounter  Medications   DISCONTD: acetaminophen (TYLENOL)  tablet 500 mg   acetaminophen (TYLENOL) tablet 1,000 mg   ibuprofen (ADVIL) tablet 600 mg      *This clinic note was created using [employer]. Therefore, there may be occasional mistakes despite careful proofreading.  ?    Melynda Ripple, MD 10/03/22 [id number]    Melynda Ripple, MD 10/03/22 Lilyan Punt, MD 10/03/22 2034

## 2022-10-03 NOTE — ED Triage Notes (Signed)
Patient was seen yesterday at her PCP for Hypertension.  Patient states that they increased her Valsartan yesterday and she took the new dose last night.  Patient states that she has had HA and shakiness that started around 9:30 am this morning.  Patient denies chest pain or SOB.  Patient denies dizziness. Patient denies N/V.

## 2022-10-03 NOTE — Discharge Instructions (Addendum)
Go home, take your blood pressure medications.  Wait an hour, take your blood pressure, give [place] a call and let us know how you are doing and what your blood pressure reading is.  If you are feeling better, then we can try managing this at home.  If you start to feel worse, then you need to go to the emergency department.

## 2022-10-20 ENCOUNTER — Ambulatory Visit: Payer: No Typology Code available for payment source | Admitting: Orthopedic Surgery

## 2022-10-28 ENCOUNTER — Ambulatory Visit: Payer: No Typology Code available for payment source | Attending: Internal Medicine | Admitting: Internal Medicine

## 2022-10-28 ENCOUNTER — Encounter: Payer: Self-pay | Admitting: Internal Medicine

## 2022-10-28 ENCOUNTER — Telehealth: Payer: Self-pay | Admitting: Internal Medicine

## 2022-10-28 ENCOUNTER — Other Ambulatory Visit: Payer: Self-pay | Admitting: Internal Medicine

## 2022-10-28 VITALS — BP 186/120 | HR 86 | Ht 65.0 in | Wt 168.6 lb

## 2022-10-28 DIAGNOSIS — I479 Paroxysmal tachycardia, unspecified: Secondary | ICD-10-CM

## 2022-10-28 DIAGNOSIS — I495 Sick sinus syndrome: Secondary | ICD-10-CM

## 2022-10-28 DIAGNOSIS — I10 Essential (primary) hypertension: Secondary | ICD-10-CM

## 2022-10-28 DIAGNOSIS — R202 Paresthesia of skin: Secondary | ICD-10-CM

## 2022-10-28 DIAGNOSIS — R200 Anesthesia of skin: Secondary | ICD-10-CM | POA: Insufficient documentation

## 2022-10-28 MED ORDER — LOSARTAN POTASSIUM 50 MG PO TABS: 50.0000 mg | ORAL_TABLET | Freq: Every day | ORAL | 2 refills | Status: DC

## 2022-10-28 MED ORDER — AMLODIPINE BESYLATE 5 MG PO TABS: 5.0000 mg | ORAL_TABLET | Freq: Every day | ORAL | 2 refills | Status: DC

## 2022-10-28 MED ORDER — CHLORTHALIDONE 25 MG PO TABS: 25.0000 mg | ORAL_TABLET | Freq: Every day | ORAL | 2 refills | Status: DC

## 2022-10-28 NOTE — Progress Notes (Signed)
Cardiology Office Note  Date: 10/28/2022   ID: Heather Roach, DOB [birth year], MRN [id number]  PCP:  [name], NP  Cardiologist:  Chalmers Guest, MD Electrophysiologist:  None   Reason for Office Visit: Management of HTN at the request of Heather Brine, NP   History of Present Illness: Heather Roach is a 51 y.o. female known to have HTN was referred to cardiology clinic for med management of HTN.  Patient is currently on HCTZ 50 mg once daily, valsartan 80 mg once daily and metoprolol succinate 25 mg once daily for HTN management. She checks her blood pressure at home which is usually in the range of 170 mmHg SBP.  She tried amlodipine in the past but it made her feel nauseous. Upon further inquiry, she said she was taking amlodipine on empty stomach. Denied any chest discomfort but has isolated left arm numbness/tingling sensation. Denied DOE, dizziness/lightheadedness, syncope, palpitations, LE swelling. She works from [employer] every day and eats fast food/processed food most of the time. Denies pregnancy risk, illicit drug abuse and alcohol use.  No family history of premature ASCVD.  Past Medical History:  Diagnosis Date   Hypertension     Past Surgical History:  Procedure Laterality Date   NO PAST SURGERIES      Current Outpatient Medications  Medication Sig Dispense Refill   Accu-Chek Softclix Lancets lancets Use as instructed 100 each 12   amLODipine (NORVASC) 5 MG tablet Take 1 tablet (5 mg total) by mouth daily. 30 tablet 2   Blood Glucose Monitoring Suppl (ACCU-CHEK GUIDE) w/Device KIT 1 Units by Does not apply route daily. 1 kit 0   chlorthalidone (HYGROTON) 25 MG tablet Take 1 tablet (25 mg total) by mouth daily. 30 tablet 2   glucose blood (ACCU-CHEK GUIDE) test strip USE AS DIRECTED TWICE A DAY TO TEST BLOOD SUGARS 100 each 12   losartan (COZAAR) 50 MG tablet Take 1 tablet (50 mg total) by mouth daily. 30 tablet 2   metFORMIN (GLUCOPHAGE-XR) 500 MG 24 hr tablet  Take 1 tablet (500 mg total) by mouth daily with supper. 30 tablet 2   No current facility-administered medications for this visit.   Allergies:  Patient has no known allergies.   Social History: The patient  reports that she has never smoked. She has never been exposed to tobacco smoke. She has never used smokeless tobacco. She reports that she does not currently use alcohol. She reports that she does not currently use drugs.   Family History: The patient's family history includes Cancer in her sister; Diabetes in her brother and mother; Heart failure in her mother; Hypertension in her mother and sister; Sarcoidosis in her sister.   ROS:  Please see the history of present illness. Otherwise, complete review of systems is positive for none.  All other systems are reviewed and negative.   Physical Exam: VS:  BP (!) 186/120 (BP Location: Right Arm, Cuff Size: Normal)   Pulse 86   Ht _0  (1.651 m)   Wt 168 lb 9.6 oz (76.5 kg)   LMP 09/22/2022 (Approximate)   SpO2 98%   BMI 28.06 kg/m , BMI Body mass index is 28.06 kg/m.  Wt Readings from Last 3 Encounters:  10/28/22 168 lb 9.6 oz (76.5 kg)  10/03/22 165 lb (74.8 kg)  09/18/22 171 lb (77.6 kg)    General: Patient appears comfortable at rest. HEENT: Conjunctiva and lids normal, oropharynx clear with moist mucosa. Neck: Supple, no elevated JVP  or carotid bruits, no thyromegaly. Lungs: Clear to auscultation, nonlabored breathing at rest. Cardiac: Regular rate and rhythm, no S3 or significant systolic murmur, no pericardial rub. Abdomen: Soft, nontender, no hepatomegaly, bowel sounds present, no guarding or rebound. Extremities: No pitting edema, distal pulses 2+. Skin: Warm and dry. Musculoskeletal: No kyphosis. Neuropsychiatric: Alert and oriented x3, affect grossly appropriate.  ECG:  NSR  Recent Labwork: 02/11/2022: ALT 24; AST 20 02/24/2022: TSH 4.440 10/03/2022: BUN 12; Creatinine, Ser 0.99; Hemoglobin 12.8; Platelets 291;  Potassium 3.8; Sodium 133     Component Value Date/Time   CHOL 147 02/24/2022 0000   TRIG 128 02/24/2022 0000   HDL 33 (L) 02/24/2022 0000   CHOLHDL 4.5 (H) 02/24/2022 0000   LDLCALC 91 02/24/2022 0000    Other Studies Reviewed Today: I personally reviewed the heart care referral records from the PCP.  Assessment and Plan: Patient is a 51 year old F known to have HTN was referred to cardiology clinic for medical management of HTN.  # HTN, poorly controlled -Discontinue current medications, HCTZ, valsartan and metoprolol succinate. Start amlodipine 5 mg once daily (to be taken after meals and not on empty stomach), start chlorthalidone 25 mg once daily and losartan 50 mg once daily.  Instructed patient to check her blood pressures in a.m. and p.m. before she takes her antihypertensive medications. She voiced understanding. -I had a lengthy discussion today with the patient about switching her dietary lifestyle from fast food/processed food to healthy 2 g sodium diet. She voiced understanding. -Obtain 2D echocardiogram  # Left arm numbness/tingling  -Patient denied any angina but has isolated left arm numbness/tingling feeling. No indication of cardiac testing at this time. Follow-up with PCP.  I have spent a total of 45 minutes with patient reviewing chart, EKGs, labs and examining patient as well as establishing an assessment and plan that was discussed with the patient.  > 50% of time was spent in direct patient care.      Medication Adjustments/Labs and Tests Ordered: Current medicines are reviewed at length with the patient today.  Concerns regarding medicines are outlined above.   Tests Ordered: Orders Placed This Encounter  Procedures   ECHOCARDIOGRAM COMPLETE    Medication Changes: Meds ordered this encounter  Medications   amLODipine (NORVASC) 5 MG tablet    Sig: Take 1 tablet (5 mg total) by mouth daily.    Dispense:  30 tablet    Refill:  2    10/28/2022 NEW    chlorthalidone (HYGROTON) 25 MG tablet    Sig: Take 1 tablet (25 mg total) by mouth daily.    Dispense:  30 tablet    Refill:  2    10/28/2022 NEW-stop hctz   losartan (COZAAR) 50 MG tablet    Sig: Take 1 tablet (50 mg total) by mouth daily.    Dispense:  30 tablet    Refill:  2    10/28/2022 NEW-stop valsartan    Disposition:  Follow up one month Telephone appointment and 6 months in-person appointment  Signed Maggie Senseney Fidel Levy, MD, 10/28/2022 9:04 AM    [facility], [place], [place] [address]

## 2022-10-28 NOTE — Patient Instructions (Addendum)
Medication Instructions:  Your physician has recommended you make the following change in your medication:  Stop metoprolol succinate Stop hydrochlorothiazide Stop valsartan Start amlodipine 5 mg daily Start chlorthalidone 25 mg daily Start losartan 50 mg daily Continue other medications the same  Labwork: none  Testing/Procedures: Your physician has requested that you have an echocardiogram. Echocardiography is a painless test that uses sound waves to create images of your heart. It provides your doctor with information about the size and shape of your heart and how well your heart's chambers and valves are working. This procedure takes approximately one hour. There are no restrictions for this procedure. Please do NOT wear cologne, perfume, aftershave, or lotions (deodorant is allowed). Please arrive 15 minutes prior to your appointment time.  Follow-Up: Your physician recommends that you schedule a follow-up appointment in: 1 month (phone) Your physician recommends that you schedule a follow-up appointment in: 6 months (in-person)  Any Other Special Instructions Will Be Listed Below (If Applicable).  If you need a refill on your cardiac medications before your next appointment, please call your pharmacy.

## 2022-10-28 NOTE — Telephone Encounter (Signed)
  Patient Consent for Virtual Visit        Heather Roach has provided verbal consent on 10/28/2022 for a virtual visit (video or telephone).   CONSENT FOR VIRTUAL VISIT FOR:  Heather Roach  By participating in this virtual visit I agree to the following:  I hereby voluntarily request, consent and authorize [facility] and its employed or contracted physicians, physician assistants, nurse practitioners or other licensed health care professionals (the Practitioner), to provide me with telemedicine health care services (the "Services") as deemed necessary by the treating Practitioner. I acknowledge and consent to receive the Services by the Practitioner via telemedicine. I understand that the telemedicine visit will involve communicating with the Practitioner through live audiovisual communication technology and the disclosure of certain medical information by electronic transmission. I acknowledge that I have been given the opportunity to request an in-person assessment or other available alternative prior to the telemedicine visit and am voluntarily participating in the telemedicine visit.  I understand that I have the right to withhold or withdraw my consent to the use of telemedicine in the course of my care at any time, without affecting my right to future care or treatment, and that the Practitioner or I may terminate the telemedicine visit at any time. I understand that I have the right to inspect all information obtained and/or recorded in the course of the telemedicine visit and may receive copies of available information for a reasonable fee.  I understand that some of the potential risks of receiving the Services via telemedicine include:  Delay or interruption in medical evaluation due to technological equipment failure or disruption; Information transmitted may not be sufficient (e.g. poor resolution of images) to allow for appropriate medical decision making by the Practitioner;  and/or  In rare instances, security protocols could fail, causing a breach of personal health information.  Furthermore, I acknowledge that it is my responsibility to provide information about my medical history, conditions and care that is complete and accurate to the best of my ability. I acknowledge that Practitioner's advice, recommendations, and/or decision may be based on factors not within their control, such as incomplete or inaccurate data provided by me or distortions of diagnostic images or specimens that may result from electronic transmissions. I understand that the practice of medicine is not an exact science and that Practitioner makes no warranties or guarantees regarding treatment outcomes. I acknowledge that a copy of this consent can be made available to me via my patient portal [facility] MyChart), or I can request a printed copy by calling the office of [facility].    I understand that my insurance will be billed for this visit.   I have read or had this consent read to me. I understand the contents of this consent, which adequately explains the benefits and risks of the Services being provided via telemedicine.  I have been provided ample opportunity to ask questions regarding this consent and the Services and have had my questions answered to my satisfaction. I give my informed consent for the services to be provided through the use of telemedicine in my medical care

## 2022-10-28 NOTE — Telephone Encounter (Signed)
ERROR

## 2022-11-03 ENCOUNTER — Ambulatory Visit: Payer: No Typology Code available for payment source | Admitting: Orthopedic Surgery

## 2022-11-13 ENCOUNTER — Ambulatory Visit: Payer: No Typology Code available for payment source | Attending: Cardiology

## 2022-11-13 DIAGNOSIS — R200 Anesthesia of skin: Secondary | ICD-10-CM

## 2022-11-13 DIAGNOSIS — R202 Paresthesia of skin: Secondary | ICD-10-CM

## 2022-11-13 DIAGNOSIS — I479 Paroxysmal tachycardia, unspecified: Secondary | ICD-10-CM

## 2022-11-13 LAB — ECHOCARDIOGRAM COMPLETE
AR max vel: 2.23 cm2
AV Area VTI: 2 cm2
AV Area mean vel: 2.29 cm2
AV Mean grad: 3.1 mmHg
AV Peak grad: 5.6 mmHg
Ao pk vel: 1.19 m/s
Area-P 1/2: 4.06 cm2
Calc EF: 56.4 %
S' Lateral: 2.3 cm
Single Plane A2C EF: 57.4 %
Single Plane A4C EF: 56.5 %

## 2022-11-18 NOTE — Progress Notes (Signed)
Patient ID: Heather Roach, female    DOB: [birth year]  MRN: [id number]  CC: Chronic Care Management   Subjective: Heather Roach is a 52 y.o. female who presents for chronic care management. She is accompanied by her wife.   Her concerns today include:  - Taking Metformin only once weekly in the morning because it makes her feel drowsy.  - Established with Cardiology. She is aware to keep all scheduled appointments with the same. Blood pressure elevated today in office. She denies red flag symptoms.  - Would like to begin weight loss medication. She is trying to monitor what she eats. She has a physically demanding job. Acknowledges weight loss will improve blood pressure and diabetes.   Patient Active Problem List   Diagnosis Date Noted   Numbness and tingling in left arm 10/28/2022   Essential hypertension 02/21/2022   Tachycardia 02/21/2022   Elevated random blood glucose level 02/21/2022   Hypocalcemia 02/21/2022     Current Outpatient Medications on File Prior to Visit  Medication Sig Dispense Refill   Accu-Chek Softclix Lancets lancets Use as instructed 100 each 12   amLODipine (NORVASC) 5 MG tablet Take 1 tablet (5 mg total) by mouth daily. 30 tablet 2   Blood Glucose Monitoring Suppl (ACCU-CHEK GUIDE) w/Device KIT 1 Units by Does not apply route daily. 1 kit 0   chlorthalidone (HYGROTON) 25 MG tablet Take 1 tablet (25 mg total) by mouth daily. 30 tablet 2   glucose blood (ACCU-CHEK GUIDE) test strip USE AS DIRECTED TWICE A DAY TO TEST BLOOD SUGARS 100 each 12   losartan (COZAAR) 50 MG tablet Take 1 tablet (50 mg total) by mouth daily. 30 tablet 2   No current facility-administered medications on file prior to visit.    No Known Allergies  Social History   Socioeconomic History   Marital status: Married    Spouse name: Not on file   Number of children: Not on file   Years of education: Not on file   Highest education level: Not on file  Occupational History   Not  on file  Tobacco Use   Smoking status: Never    Passive exposure: Never   Smokeless tobacco: Never  Vaping Use   Vaping Use: Never used  Substance and Sexual Activity   Alcohol use: Not Currently   Drug use: Not Currently   Sexual activity: Yes  Other Topics Concern   Not on file  Social History Narrative   Not on file   Social Determinants of Health   Financial Resource Strain: Not on file  Food Insecurity: Not on file  Transportation Needs: Not on file  Physical Activity: Not on file  Stress: Not on file  Social Connections: Not on file  Intimate Partner Violence: Not on file    Family History  Problem Relation Age of Onset   Diabetes Mother    Heart failure Mother    Hypertension Mother    Hypertension Sister    Cancer Sister    Sarcoidosis Sister    Diabetes Brother     Past Surgical History:  Procedure Laterality Date   NO PAST SURGERIES      ROS: Review of Systems Negative except as stated above  PHYSICAL EXAM: BP (!) 171/120   Pulse 82   Ht _0  (1.626 m)   Wt 167 lb 3.2 oz (75.8 kg)   SpO2 94%   BMI 28.70 kg/m   Physical Exam HENT:  Head: Normocephalic and atraumatic.  Eyes:     Extraocular Movements: Extraocular movements intact.     Conjunctiva/sclera: Conjunctivae normal.     Pupils: Pupils are equal, round, and reactive to light.  Cardiovascular:     Rate and Rhythm: Normal rate and regular rhythm.     Pulses: Normal pulses.     Heart sounds: Normal heart sounds.  Pulmonary:     Effort: Pulmonary effort is normal.     Breath sounds: Normal breath sounds.  Musculoskeletal:     Cervical back: Normal range of motion and neck supple.  Neurological:     General: No focal deficit present.     Mental Status: She is alert and oriented to person, place, and time.  Psychiatric:        Mood and Affect: Mood normal.        Behavior: Behavior normal.   Results for orders placed or performed in visit on 11/21/22  POCT glycosylated  hemoglobin (Hb A1C)  Result Value Ref Range   Hemoglobin A1C     HbA1c POC (<> result, manual entry)     HbA1c, POC (prediabetic range)     HbA1c, POC (controlled diabetic range) 7.3 (A) 0.0 - 7.0 %    ASSESSMENT AND PLAN: 1. Type 2 diabetes mellitus without complication, without long-term current use of insulin (HCC) - Hemoglobin A1c not at goal at 7.3%, goal 7%.  - Patient reports she is not taking Metformin daily as prescribed. Patient reports she is taking Metformin only once weekly in the morning due to side effect of making her feel drowsy. - Discussed with patient to begin taking Metformin daily with supper/before bedtime around 7:30 pm. She reports her usual bedtime is around 8:00 pm.  - Discussed the importance of healthy eating habits, low-carbohydrate diet, low-sugar diet, regular aerobic exercise (at least 150 minutes a week as tolerated) and medication compliance to achieve or maintain control of diabetes. - Follow-up with primary provider in 4 weeks/sooner if needed if new regimen still causes side effect of drowsiness. Otherwise follow-up in 3 months or sooner if needed.  - POCT glycosylated hemoglobin (Hb A1C) - metFORMIN (GLUCOPHAGE-XR) 500 MG 24 hr tablet; Take 1 tablet (500 mg total) by mouth daily with supper.  Dispense: 30 tablet; Refill: 2  2. Encounter for weight management - Discussed with patient importance of healthy eating habits, low-carbohydrate diet, low-sugar diet, regular aerobic exercise (at least 150 minutes a week as tolerated).  - Will hold beginning weight loss medication as of present until patient's blood pressure improved and stabilized. Patient is aware to follow-up with me at that time to begin regimen.    Patient was given the opportunity to ask questions.  Patient verbalized understanding of the plan and was able to repeat key elements of the plan. Patient was given clear instructions to go to Emergency Department or return to medical center if  symptoms don't improve, worsen, or new problems develop.The patient verbalized understanding.   Orders Placed This Encounter  Procedures   POCT glycosylated hemoglobin (Hb A1C)    Requested Prescriptions   Signed Prescriptions Disp Refills   metFORMIN (GLUCOPHAGE-XR) 500 MG 24 hr tablet 30 tablet 2    Sig: Take 1 tablet (500 mg total) by mouth daily with supper.    Return in about 3 months (around 02/20/2023) for Follow-Up or next available chronic care mgmt .  [name], NP

## 2022-11-21 ENCOUNTER — Encounter: Payer: Self-pay | Admitting: Family

## 2022-11-21 ENCOUNTER — Ambulatory Visit (INDEPENDENT_AMBULATORY_CARE_PROVIDER_SITE_OTHER): Payer: Self-pay | Admitting: Family

## 2022-11-21 VITALS — BP 171/120 | HR 82 | Ht 64.0 in | Wt 167.2 lb

## 2022-11-21 DIAGNOSIS — Z7689 Persons encountering health services in other specified circumstances: Secondary | ICD-10-CM

## 2022-11-21 DIAGNOSIS — E119 Type 2 diabetes mellitus without complications: Secondary | ICD-10-CM

## 2022-11-21 LAB — POCT GLYCOSYLATED HEMOGLOBIN (HGB A1C): HbA1c, POC (controlled diabetic range): 7.3 % — AB (ref 0.0–7.0)

## 2022-11-21 MED ORDER — METFORMIN HCL ER 500 MG PO TB24: 500.0000 mg | ORAL_TABLET | Freq: Every day | ORAL | 2 refills | Status: DC

## 2022-11-21 NOTE — Patient Instructions (Signed)
Metformin Tablets What is this medication? METFORMIN (met FOR min) treats type 2 diabetes. It controls blood sugar (glucose) and helps your body use insulin effectively. This medication is often combined with changes to diet and exercise. This medicine may be used for other purposes; ask your health care provider or pharmacist if you have questions. COMMON BRAND NAME(S): Glucophage What should I tell my care team before I take this medication? They need to know if you have any of these conditions: Anemia Dehydration Frequently drink alcohol Heart disease Kidney disease Liver disease Polycystic ovary syndrome Serious infection or injury Vomiting An unusual or allergic reaction to metformin, other medications, foods, dyes, or preservatives Pregnant or trying to get pregnant Breastfeeding How should I use this medication? Take this medication by mouth with a glass of water. Take it as directed on the prescription label at the same time every day. Take it with food. Keep taking it unless your care team tells you to stop. Talk to your care team about the use of this medication in children. While it may be prescribed for children as young as 10 years of age for selected conditions, precautions do apply. Overdosage: If you think you have taken too much of this medicine contact a poison control center or emergency room at once. NOTE: This medicine is only for you. Do not share this medicine with others. What if I miss a dose? If you miss a dose, take it as soon as you can. If it is almost time for your next dose, take only that dose. Do not take double or extra doses. What may interact with this medication? Do not take this medication with any of the following: Certain contrast medications given before X-rays, CT scans, MRI, or other procedures Dofetilide This medication may also interact with the following: Acetazolamide Alcohol Certain antivirals for HIV or hepatitis Certain medications  for blood pressure, heart disease, irregular heart beat Cimetidine Dichlorphenamide Digoxin Diuretics Estrogen or progestin hormones Glycopyrrolate Isoniazid Lamotrigine Memantine Methazolamide Metoclopramide Midodrine Niacin Phenothiazines, such as chlorpromazine, mesoridazine, prochlorperazine, thioridazine Phenytoin Ranolazine Steroid medications, such as prednisone or cortisone Stimulant medications for ADHD, weight loss, or staying awake Thyroid medications Topiramate Trospium Vandetanib Zonisamide This list may not describe all possible interactions. Give your health care provider a list of all the medicines, herbs, non-prescription drugs, or dietary supplements you use. Also tell them if you smoke, drink alcohol, or use illegal drugs. Some items may interact with your medicine. What should I watch for while using this medication? Visit your care team for regular checks on your progress. Tell your care team if your symptoms do not start to get better or if they get worse. A test called the HbA1C (A1C) will be monitored. This is a simple blood test. It measures your blood sugar control over the last 2 to 3 months. You will receive this test every 3 to 6 months. Using this medication with insulin or a sulfonylurea may increase your risk of hypoglycemia. Learn how to check your blood sugar. Learn the symptoms of low and high blood sugar and how to manage them. Always carry a quick-source of sugar with you in case you have symptoms of low blood sugar. Examples include hard sugar candy or glucose tablets. Make sure others know that you can choke if you eat or drink when you develop serious symptoms of low blood sugar, such as seizures or unconsciousness. They must get medical help at once. Tell your care team if you have high   blood sugar. You might need to change the dose of your medication. If you are sick or exercising more than usual, you might need to change the dose of your  medication. Do not skip meals. Ask your care team if you should avoid alcohol. Many nonprescription cough and cold products contain sugar or alcohol. These can affect blood sugar. This medication may cause you to ovulate, which may increase your chances of becoming pregnant. Talk with your care team about contraception while you are taking this medication. Contact your care team if you think you might be pregnant. If you are going to need surgery, an MRI, CT scan, or other procedure, tell your care team that you are taking this medication. You may need to stop taking this medication before the procedure. Wear a medical ID bracelet or chain. Carry a card that describes your condition. List the medications and doses you take on the card. This medication may cause a decrease in folic acid and vitamin B12. You should make sure that you get enough vitamins while you are taking this medication. Discuss the foods you eat and the vitamins you take with your care team. What side effects may I notice from receiving this medication? Side effects that you should report to your care team as soon as possible: Allergic reactions--skin rash, itching, hives, swelling of the face, lips, tongue, or throat High lactic acid level--muscle pain or cramps, stomach pain, trouble breathing, general discomfort or fatigue Low vitamin B12 level--pain, tingling, or numbness in the hands or feet, muscle weakness, dizziness, confusion, difficulty concentrating Side effects that usually do not require medical attention (report to your care team if they continue or are bothersome): Diarrhea Gas Headache Metallic taste in mouth Nausea This list may not describe all possible side effects. Call your doctor for medical advice about side effects. You may report side effects to FDA at 1-800-FDA-1088. Where should I keep my medication? Keep out of the reach of children and pets. Store between 15 and 30 degrees C (59 and 86 degrees F).  Protect from moisture and light. Get rid of any unused medication after the expiration date. To get rid of medications that are no longer needed or expired: Take the medication to a medication take-back program. Check with your pharmacy or law enforcement to find a location. If you cannot return the medication, check the label or package insert to see if the medication should be thrown out in the garbage or flushed down the toilet. If you are not sure, ask your care team. If it is safe to put in the trash, empty the medication out of the container. Mix the medication with cat litter, dirt, coffee grounds, or other unwanted substance. Seal the mixture in a bag or container. Put it in the trash. NOTE: This sheet is a summary. It may not cover all possible information. If you have questions about this medicine, talk to your doctor, pharmacist, or health care provider.  2023 Elsevier/Gold Standard (2022-06-02 00:00:00)  

## 2022-12-10 ENCOUNTER — Ambulatory Visit: Payer: Self-pay | Admitting: Internal Medicine

## 2022-12-10 NOTE — Progress Notes (Unsigned)
Cardiology Office Note  Date: 12/10/2022   ID: Heather Roach, DOB Jan 24, 1971, MRN [id number]  PCP:  [name], NP  Cardiologist:  Chalmers Guest, MD Electrophysiologist:  None   Reason for Office Visit: No chief complaint on file.   History of Present Illness: Heather Roach is a 52 y.o. female  Past Medical History:  Diagnosis Date   Hypertension     Past Surgical History:  Procedure Laterality Date   NO PAST SURGERIES      Current Outpatient Medications  Medication Sig Dispense Refill   Accu-Chek Softclix Lancets lancets Use as instructed 100 each 12   amLODipine (NORVASC) 5 MG tablet Take 1 tablet (5 mg total) by mouth daily. 30 tablet 2   Blood Glucose Monitoring Suppl (ACCU-CHEK GUIDE) w/Device KIT 1 Units by Does not apply route daily. 1 kit 0   chlorthalidone (HYGROTON) 25 MG tablet Take 1 tablet (25 mg total) by mouth daily. 30 tablet 2   glucose blood (ACCU-CHEK GUIDE) test strip USE AS DIRECTED TWICE A DAY TO TEST BLOOD SUGARS 100 each 12   losartan (COZAAR) 50 MG tablet Take 1 tablet (50 mg total) by mouth daily. 30 tablet 2   metFORMIN (GLUCOPHAGE-XR) 500 MG 24 hr tablet Take 1 tablet (500 mg total) by mouth daily with supper. 30 tablet 2   No current facility-administered medications for this visit.   Allergies:  Patient has no known allergies.   Social History: The patient  reports that she has never smoked. She has never been exposed to tobacco smoke. She has never used smokeless tobacco. She reports that she does not currently use alcohol. She reports that she does not currently use drugs.   Family History: The patient's family history includes Cancer in her sister; Diabetes in her brother and mother; Heart failure in her mother; Hypertension in her mother and sister; Sarcoidosis in her sister.   ROS:  Please see the history of present illness. Otherwise, complete review of systems is positive for {NONE DEFAULTED:18576}.  All other systems are  reviewed and negative.   Physical Exam: VS:  There were no vitals taken for this visit., BMI There is no height or weight on file to calculate BMI.  Wt Readings from Last 3 Encounters:  11/21/22 167 lb 3.2 oz (75.8 kg)  10/28/22 168 lb 9.6 oz (76.5 kg)  10/03/22 165 lb (74.8 kg)    General: Patient appears comfortable at rest. HEENT: Conjunctiva and lids normal, oropharynx clear with moist mucosa. Neck: Supple, no elevated JVP or carotid bruits, no thyromegaly. Lungs: Clear to auscultation, nonlabored breathing at rest. Cardiac: Regular rate and rhythm, no S3 or significant systolic murmur, no pericardial rub. Abdomen: Soft, nontender, no hepatomegaly, bowel sounds present, no guarding or rebound. Extremities: No pitting edema, distal pulses 2+. Skin: Warm and dry. Musculoskeletal: No kyphosis. Neuropsychiatric: Alert and oriented x3, affect grossly appropriate.  ECG:  {EKG/Telemetry Strips Reviewed:[phone]}  Recent Labwork: 02/11/2022: ALT 24; AST 20 02/24/2022: TSH 4.440 10/03/2022: BUN 12; Creatinine, Ser 0.99; Hemoglobin 12.8; Platelets 291; Potassium 3.8; Sodium 133     Component Value Date/Time   CHOL 147 02/24/2022 0000   TRIG 128 02/24/2022 0000   HDL 33 (L) 02/24/2022 0000   CHOLHDL 4.5 (H) 02/24/2022 0000   LDLCALC 91 02/24/2022 0000    Other Studies Reviewed Today:   Assessment and Plan:    Medication Adjustments/Labs and Tests Ordered: Current medicines are reviewed at length with the patient today.  Concerns  regarding medicines are outlined above.   Tests Ordered: No orders of the defined types were placed in this encounter.   Medication Changes: No orders of the defined types were placed in this encounter.   Disposition:  Follow up {follow up:15908}  Signed Wilbert Schouten Fidel Levy, MD, 12/10/2022 1:40 PM    [facility] [place], [place], [place] [id number]

## 2023-01-12 ENCOUNTER — Emergency Department (HOSPITAL_COMMUNITY): Payer: 59

## 2023-01-12 ENCOUNTER — Encounter (HOSPITAL_COMMUNITY): Payer: Self-pay | Admitting: *Deleted

## 2023-01-12 ENCOUNTER — Other Ambulatory Visit: Payer: Self-pay

## 2023-01-12 ENCOUNTER — Emergency Department (HOSPITAL_COMMUNITY)
Admission: EM | Admit: 2023-01-12 | Discharge: 2023-01-12 | Disposition: A | Discharge status: Home / Self Care | Payer: 59 | Attending: Emergency Medicine | Admitting: Emergency Medicine

## 2023-01-12 DIAGNOSIS — R0789 Other chest pain: Secondary | ICD-10-CM | POA: Diagnosis not present

## 2023-01-12 DIAGNOSIS — R079 Chest pain, unspecified: Secondary | ICD-10-CM | POA: Diagnosis not present

## 2023-01-12 DIAGNOSIS — R519 Headache, unspecified: Secondary | ICD-10-CM | POA: Insufficient documentation

## 2023-01-12 DIAGNOSIS — I10 Essential (primary) hypertension: Secondary | ICD-10-CM | POA: Insufficient documentation

## 2023-01-12 LAB — CBC
HCT: 36.8 % (ref 36.0–46.0)
Hemoglobin: 12.4 g/dL (ref 12.0–15.0)
MCH: 29.3 pg (ref 26.0–34.0)
MCHC: 33.7 g/dL (ref 30.0–36.0)
MCV: 87 fL (ref 80.0–100.0)
Platelets: 283 10*3/uL (ref 150–400)
RBC: 4.23 MIL/uL (ref 3.87–5.11)
RDW: 13.2 % (ref 11.5–15.5)
WBC: 7.3 10*3/uL (ref 4.0–10.5)
nRBC: 0 % (ref 0.0–0.2)

## 2023-01-12 LAB — BASIC METABOLIC PANEL
Anion gap: 5 (ref 5–15)
BUN: 17 mg/dL (ref 6–20)
CO2: 28 mmol/L (ref 22–32)
Calcium: 9.4 mg/dL (ref 8.9–10.3)
Chloride: 102 mmol/L (ref 98–111)
Creatinine, Ser: 1.1 mg/dL — ABNORMAL HIGH (ref 0.44–1.00)
GFR, Estimated: >60 mL/min (ref >60)
Glucose, Bld: 137 mg/dL — ABNORMAL HIGH (ref 70–99)
Potassium: 3.7 mmol/L (ref 3.5–5.1)
Sodium: 135 mmol/L (ref 135–145)

## 2023-01-12 LAB — TROPONIN I (HIGH SENSITIVITY)
Troponin I (High Sensitivity): 3 ng/L (ref <18)
Troponin I (High Sensitivity): 4 ng/L (ref <18)

## 2023-01-12 LAB — POC URINE PREG, ED: Preg Test, Ur: NEGATIVE

## 2023-01-12 NOTE — ED Triage Notes (Signed)
Pt is here for CP which began today.  Pt also has had a HA and she checked her BP and noted her BP was 201/121.  Pt reports that she is taking her BP meds as prescribed.

## 2023-01-12 NOTE — Discharge Instructions (Signed)
You were evaluated in the Emergency Department and after careful evaluation, we did not find any emergent condition requiring admission or further testing in the hospital.  Your exam/testing today was overall reassuring.  Recommend following up with your primary care doctor and/or cardiologist to discuss your symptoms and blood pressure management.  Please return to the Emergency Department if you experience any worsening of your condition.  Thank you for allowing [place] to be a part of your care.

## 2023-01-12 NOTE — ED Provider Notes (Signed)
[facility] Emergency Department Provider Note MRN:  [id number]  Arrival date & time: 01/12/23     Chief Complaint   Chest Pain and Hypertension   History of Present Illness   Heather Roach is a 52 y.o. year-old female with a history of hypertension presenting to the ED with chief complaint of chest pain.  Patient noticed her blood pressure today was over [id number] systolic.  Having some chest pain intermittently throughout the day, described as a sharp pain in the middle/left side of the chest.  No shortness of breath, no dizziness or diaphoresis, no nausea or vomiting.  No recent leg pain or swelling.  I had a headache today as well, which has improved significantly and is mild at this time.  No numbness or weakness to the arms or legs.  Review of Systems  A thorough review of systems was obtained and all systems are negative except as noted in the HPI and PMH.   Patient's Health History    Past Medical History:  Diagnosis Date   Hypertension     Past Surgical History:  Procedure Laterality Date   NO PAST SURGERIES      Family History  Problem Relation Age of Onset   Diabetes Mother    Heart failure Mother    Hypertension Mother    Hypertension Sister    Cancer Sister    Sarcoidosis Sister    Diabetes Brother     Social History   Socioeconomic History   Marital status: Married    Spouse name: Not on file   Number of children: Not on file   Years of education: Not on file   Highest education level: Not on file  Occupational History   Not on file  Tobacco Use   Smoking status: Never    Passive exposure: Never   Smokeless tobacco: Never  Vaping Use   Vaping Use: Never used  Substance and Sexual Activity   Alcohol use: Not Currently   Drug use: Not Currently   Sexual activity: Yes  Other Topics Concern   Not on file  Social History Narrative   Not on file   Social Determinants of Health   Financial Resource Strain: Not on file  Food  Insecurity: Not on file  Transportation Needs: Not on file  Physical Activity: Not on file  Stress: Not on file  Social Connections: Not on file  Intimate Partner Violence: Not on file     Physical Exam   Vitals:   01/12/23 1948 01/12/23 2230  BP: (!) 173/115 (!) 180/111  Pulse: 85 78  Resp:  (!) 21  Temp:    SpO2: 99% 97%    CONSTITUTIONAL: Well-appearing, NAD NEURO/PSYCH:  Alert and oriented x 3, no focal deficits EYES:  eyes equal and reactive ENT/NECK:  no LAD, no JVD CARDIO: Regular rate, well-perfused, normal S1 and S2 PULM:  CTAB no wheezing or rhonchi GI/GU:  non-distended, non-tender MSK/SPINE:  No gross deformities, no edema SKIN:  no rash, atraumatic   *Additional and/or pertinent findings included in MDM below  Diagnostic and Interventional Summary    EKG Interpretation  Date/Time:  Monday January 12 2023 19:59:09 EST Ventricular Rate:  95 PR Interval:  150 QRS Duration: 86 QT Interval:  348 QTC Calculation: 437 R Axis:   20 Text Interpretation: Normal sinus rhythm Nonspecific T wave abnormality Abnormal ECG When compared with ECG of 03-Oct-2022 14:54, Nonspecific T wave abnormality now evident in Anterior leads Confirmed by Noemi Chapel (  [id number]) on 01/12/2023 10:20:19 PM       Labs Reviewed  BASIC METABOLIC PANEL - Abnormal; Notable for the following components:      Result Value   Glucose, Bld 137 (*)    Creatinine, Ser 1.10 (*)    All other components within normal limits  CBC  POC URINE PREG, ED  TROPONIN I (HIGH SENSITIVITY)  TROPONIN I (HIGH SENSITIVITY)    DG Chest 2 View  Final Result      Medications - No data to display   Procedures  /  Critical Care Procedures  ED Course and Medical Decision Making  Initial Impression and Ddx Hypertension with headache and chest pain.  Differential diagnosis includes hypertensive urgency or emergency, ACS, noncardiac pain.  Patient has a completely normal neurological exam, normal and  symmetric strength and sensation, normal pronation, normal speech, headache has improved.  Highly doubt vascular emergency of the CNS, no indication for advanced imaging at this time.  Past medical/surgical history that increases complexity of ED encounter: Hypertension  Interpretation of Diagnostics I personally reviewed the EKG and my interpretation is as follows: Sinus rhythm without significant ischemic findings  Labs reassuring with no significant blood count or electrolyte disturbance, troponin negative x 2.  No evidence of endorgan damage.  Patient Reassessment and Ultimate Disposition/Management     Given that patient is feeling better, has a reassuring workup, and blood pressure is improving without intervention, patient seems appropriate for discharge with close follow-up.  Strict return precautions.  Patient management required discussion with the following services or consulting groups:  None  Complexity of Problems Addressed Acute illness or injury that poses threat of life of bodily function  Additional Data Reviewed and Analyzed Further history obtained from: Further history from spouse/family member  Additional Factors Impacting ED Encounter Risk None  Barth Kirks. [place] Small, [place] [email or web address]  Final Clinical Impressions(s) / ED Diagnoses     ICD-10-CM   1. Hypertension, unspecified type  I10     2. Chest pain, unspecified type  R07.9       ED Discharge Orders     None        Discharge Instructions Discussed with and Provided to Patient:    Discharge Instructions      You were evaluated in the Emergency Department and after careful evaluation, we did not find any emergent condition requiring admission or further testing in the hospital.  Your exam/testing today was overall reassuring.  Recommend following up with your primary care doctor and/or cardiologist to discuss your symptoms and blood  pressure management.  Please return to the Emergency Department if you experience any worsening of your condition.  Thank you for allowing [place] to be a part of your care.       Maudie Flakes, MD 01/12/23 [phone]

## 2023-01-15 ENCOUNTER — Encounter: Payer: Self-pay | Admitting: Radiology

## 2023-01-23 ENCOUNTER — Telehealth: Payer: Self-pay | Admitting: Internal Medicine

## 2023-02-10 ENCOUNTER — Ambulatory Visit (INDEPENDENT_AMBULATORY_CARE_PROVIDER_SITE_OTHER): Payer: Self-pay | Admitting: Internal Medicine

## 2023-02-10 ENCOUNTER — Telehealth: Payer: Self-pay | Admitting: *Deleted

## 2023-02-10 ENCOUNTER — Encounter: Payer: Self-pay | Admitting: Internal Medicine

## 2023-02-10 NOTE — Progress Notes (Incomplete)
Virtual Visit via Telephone Note   Because of Heather Roach's co-morbid illnesses, she is at least at moderate risk for complications without adequate follow up.  This format is felt to be most appropriate for this patient at this time.  The patient did not have access to video technology/had technical difficulties with video requiring transitioning to audio format only (telephone).  All issues noted in this document were discussed and addressed.  No physical exam could be performed with this format.  Please refer to the patient's chart for her consent to telehealth for [facility].    Date:  02/10/2023   ID:  Heather Roach, DOB 31-Oct-1971, MRN [id number] The patient was identified using 2 identifiers.  Patient Location: Home Provider Location: Office/Clinic   PCP:  [name], NP   [place] [facility] Cardiologist:  Chalmers Guest, MD { Click to update primary MD,subspecialty MD or APP then REFRESH:1}    Evaluation Performed:  Follow-Up Visit  Chief Complaint:  ***  History of Present Illness:    Heather Roach is a 52 y.o. female with    Past Medical History:  Diagnosis Date   Hypertension    Past Surgical History:  Procedure Laterality Date   NO PAST SURGERIES       Current Meds  Medication Sig   amLODipine (NORVASC) 5 MG tablet Take 1 tablet (5 mg total) by mouth daily.   diphenhydramine-acetaminophen (TYLENOL PM) 25-500 MG TABS tablet Take 1 tablet by mouth at bedtime as needed.   metFORMIN (GLUCOPHAGE-XR) 500 MG 24 hr tablet Take 1 tablet (500 mg total) by mouth daily with supper.     Allergies:   Patient has no known allergies.   Social History   Tobacco Use   Smoking status: Never    Passive exposure: Never   Smokeless tobacco: Never  Vaping Use   Vaping Use: Never used  Substance Use Topics   Alcohol use: Not Currently   Drug use: Not Currently     Family Hx: The patient's family history includes Cancer in her  sister; Diabetes in her brother and mother; Heart failure in her mother; Hypertension in her mother and sister; Sarcoidosis in her sister.  ROS:   Please see the history of present illness.     All other systems reviewed and are negative.   Prior CV studies:   The following studies were reviewed today:    Labs/Other Tests and Data Reviewed:    EKG:    Recent Labs: 02/11/2022: ALT 24 02/24/2022: TSH 4.440 01/12/2023: BUN 17; Creatinine, Ser 1.10; Hemoglobin 12.4; Platelets 283; Potassium 3.7; Sodium 135   Recent Lipid Panel Lab Results  Component Value Date/Time   CHOL 147 02/24/2022 12:00 AM   TRIG 128 02/24/2022 12:00 AM   HDL 33 (L) 02/24/2022 12:00 AM   CHOLHDL 4.5 (H) 02/24/2022 12:00 AM   LDLCALC 91 02/24/2022 12:00 AM    Wt Readings from Last 3 Encounters:  02/10/23 169 lb (76.7 kg)  11/21/22 167 lb 3.2 oz (75.8 kg)  10/28/22 168 lb 9.6 oz (76.5 kg)     Risk Assessment/Calculations:         Objective:    Vital Signs:  BP (!) 165/102   Ht 5\' 5"  (1.651 m)   Wt 169 lb (76.7 kg)   BMI 28.12 kg/m      ASSESSMENT & PLAN:               Time:   Today,  I have spent 10 minutes with the patient with telehealth technology discussing the above problems.     Medication Adjustments/Labs and Tests Ordered: Current medicines are reviewed at length with the patient today.  Concerns regarding medicines are outlined above.   Tests Ordered: No orders of the defined types were placed in this encounter.   Medication Changes: No orders of the defined types were placed in this encounter.   Follow Up:  In Person  6 months  Signed, Chalmers Guest, MD  02/10/2023 2:48 PM    [place]

## 2023-02-10 NOTE — Telephone Encounter (Signed)
Per [place]-Called three times already. First time at 2:50 pm, she said she is in a zoom meeting and asked me to call back in 20 min. I called back at 3:10 and 3:30 pm with no response. Cancel the appointment and have her see her see me in person. Not taking HTN medications that I started her on. Follow up with her PCP and schedule with me in 6 months in person.

## 2023-02-16 NOTE — Progress Notes (Signed)
Erroneous encounter-disregard

## 2023-02-20 ENCOUNTER — Encounter: Payer: Self-pay | Admitting: Family

## 2023-02-20 DIAGNOSIS — E119 Type 2 diabetes mellitus without complications: Secondary | ICD-10-CM

## 2023-03-02 ENCOUNTER — Encounter: Payer: Self-pay | Admitting: Internal Medicine

## 2023-03-02 NOTE — Progress Notes (Signed)
Erroneous encounter - please disregard.

## 2023-03-27 ENCOUNTER — Encounter: Payer: Self-pay | Admitting: Family

## 2023-05-08 ENCOUNTER — Ambulatory Visit: Payer: No Typology Code available for payment source | Admitting: Internal Medicine

## 2023-06-03 ENCOUNTER — Ambulatory Visit (INDEPENDENT_AMBULATORY_CARE_PROVIDER_SITE_OTHER): Payer: 59 | Admitting: Family

## 2023-06-03 ENCOUNTER — Encounter: Payer: Self-pay | Admitting: Family

## 2023-06-03 ENCOUNTER — Other Ambulatory Visit (HOSPITAL_COMMUNITY)
Admission: RE | Admit: 2023-06-03 | Discharge: 2023-06-03 | Disposition: A | Discharge status: Home / Self Care | Payer: 59 | Source: Ambulatory Visit | Attending: Family | Admitting: Family

## 2023-06-03 VITALS — BP 152/87 | HR 75 | Temp 98.6°F | Ht 64.5 in | Wt 168.2 lb

## 2023-06-03 DIAGNOSIS — Z124 Encounter for screening for malignant neoplasm of cervix: Secondary | ICD-10-CM | POA: Diagnosis not present

## 2023-06-03 DIAGNOSIS — Z0000 Encounter for general adult medical examination without abnormal findings: Secondary | ICD-10-CM | POA: Insufficient documentation

## 2023-06-03 DIAGNOSIS — Z114 Encounter for screening for human immunodeficiency virus [HIV]: Secondary | ICD-10-CM

## 2023-06-03 DIAGNOSIS — Z599 Problem related to housing and economic circumstances, unspecified: Secondary | ICD-10-CM

## 2023-06-03 DIAGNOSIS — Z1231 Encounter for screening mammogram for malignant neoplasm of breast: Secondary | ICD-10-CM

## 2023-06-03 DIAGNOSIS — Z13220 Encounter for screening for lipoid disorders: Secondary | ICD-10-CM

## 2023-06-03 DIAGNOSIS — E119 Type 2 diabetes mellitus without complications: Secondary | ICD-10-CM

## 2023-06-03 DIAGNOSIS — Z7984 Long term (current) use of oral hypoglycemic drugs: Secondary | ICD-10-CM

## 2023-06-03 DIAGNOSIS — Z113 Encounter for screening for infections with a predominantly sexual mode of transmission: Secondary | ICD-10-CM

## 2023-06-03 DIAGNOSIS — Z1211 Encounter for screening for malignant neoplasm of colon: Secondary | ICD-10-CM

## 2023-06-03 DIAGNOSIS — Z0001 Encounter for general adult medical examination with abnormal findings: Secondary | ICD-10-CM | POA: Diagnosis not present

## 2023-06-03 DIAGNOSIS — Z13228 Encounter for screening for other metabolic disorders: Secondary | ICD-10-CM

## 2023-06-03 DIAGNOSIS — Z130 Encounter for screening for diseases of the blood and blood-forming organs and certain disorders involving the immune mechanism: Secondary | ICD-10-CM

## 2023-06-03 DIAGNOSIS — Z1329 Encounter for screening for other suspected endocrine disorder: Secondary | ICD-10-CM

## 2023-06-03 LAB — POCT GLYCOSYLATED HEMOGLOBIN (HGB A1C): HbA1c, POC (controlled diabetic range): 7.4 % — AB (ref 0.0–7.0)

## 2023-06-03 MED ORDER — GLIMEPIRIDE 1 MG PO TABS: 1.0000 mg | ORAL_TABLET | Freq: Every day | ORAL | 1 refills | Status: DC

## 2023-06-03 NOTE — Addendum Note (Signed)
Addended by: Rema Fendt on: 06/03/2023 12:35 PM   Modules accepted: Orders

## 2023-06-03 NOTE — Progress Notes (Signed)
Pt wants pap and is okay with me being in the room to assist.   Pt wants to ask about a colonoscopy.

## 2023-06-03 NOTE — Progress Notes (Addendum)
Patient ID: Heather Roach, female    DOB: 1971/09/17  MRN: [id number]  CC: Annual Exam  Subjective: Heather Roach is a 52 y.o. female who presents for annual exam.   Her concerns today include:  - Reports she received a notification to schedule mammogram. States she plans to schedule an appointment soon. She is aware to notify primary provider if she experiences any difficulties scheduling an appointment.  - Reports not taking Metformin XR consistently. States that she "doesn't like taking pills" because it "makes me feel bad". She denies red flag symptoms associated with diabetes.  - Reports "skin issues" of bilateral feet. Thinks may be related to diabetes.  - Established with Cardiology. She is aware to keep all scheduled appointments. - Reports she is in the process of getting a new health insurance approved.  - No further issues/concerns for discussion today.    Patient Active Problem List   Diagnosis Date Noted   ERRONEOUS ENCOUNTER--DISREGARD 03/02/2023   Numbness and tingling in left arm 10/28/2022   Essential hypertension 02/21/2022   Tachycardia 02/21/2022   Elevated random blood glucose level 02/21/2022   Hypocalcemia 02/21/2022     Current Outpatient Medications on File Prior to Visit  Medication Sig Dispense Refill   Accu-Chek Softclix Lancets lancets Use as instructed 100 each 12   Blood Glucose Monitoring Suppl (ACCU-CHEK GUIDE) w/Device KIT 1 Units by Does not apply route daily. 1 kit 0   diphenhydramine-acetaminophen (TYLENOL PM) 25-500 MG TABS tablet Take 1 tablet by mouth at bedtime as needed.     glucose blood (ACCU-CHEK GUIDE) test strip USE AS DIRECTED TWICE A DAY TO TEST BLOOD SUGARS 100 each 12   amLODipine (NORVASC) 5 MG tablet Take 1 tablet (5 mg total) by mouth daily. (Patient not taking: Reported on 06/03/2023) 30 tablet 2   chlorthalidone (HYGROTON) 25 MG tablet Take 1 tablet (25 mg total) by mouth daily. (Patient not taking: Reported on 02/10/2023) 30  tablet 2   losartan (COZAAR) 50 MG tablet Take 1 tablet (50 mg total) by mouth daily. (Patient not taking: Reported on 02/10/2023) 30 tablet 2   No current facility-administered medications on file prior to visit.    No Known Allergies  Social History   Socioeconomic History   Marital status: Married    Spouse name: Not on file   Number of children: Not on file   Years of education: Not on file   Highest education level: Not on file  Occupational History   Not on file  Tobacco Use   Smoking status: Never    Passive exposure: Never   Smokeless tobacco: Never  Vaping Use   Vaping status: Never Used  Substance and Sexual Activity   Alcohol use: Not Currently   Drug use: Not Currently   Sexual activity: Yes  Other Topics Concern   Not on file  Social History Narrative   Not on file   Social Determinants of Health   Financial Resource Strain: Not on file  Food Insecurity: Not on file  Transportation Needs: Not on file  Physical Activity: Not on file  Stress: Not on file  Social Connections: Not on file  Intimate Partner Violence: Not on file    Family History  Problem Relation Age of Onset   Diabetes Mother    Heart failure Mother    Hypertension Mother    Hypertension Sister    Cancer Sister    Sarcoidosis Sister    Diabetes Brother  Past Surgical History:  Procedure Laterality Date   NO PAST SURGERIES      ROS: Review of Systems Negative except as stated above  PHYSICAL EXAM: BP (!) 152/87   Pulse 75   Temp 98.6 F (37 C) (Oral)   Ht 5' 4.5" (1.638 m)   Wt 168 lb 3.2 oz (76.3 kg)   LMP 05/18/2023 (Exact Date)   SpO2 98%   BMI 28.43 kg/m   Physical Exam HENT:     Head: Normocephalic and atraumatic.     Right Ear: Tympanic membrane, ear canal and external ear normal.     Left Ear: Tympanic membrane, ear canal and external ear normal.     Nose: Nose normal.     Mouth/Throat:     Mouth: Mucous membranes are moist.     Pharynx: Oropharynx  is clear.  Eyes:     Extraocular Movements: Extraocular movements intact.     Conjunctiva/sclera: Conjunctivae normal.     Pupils: Pupils are equal, round, and reactive to light.  Cardiovascular:     Rate and Rhythm: Normal rate and regular rhythm.     Pulses: Normal pulses.     Heart sounds: Normal heart sounds.  Pulmonary:     Effort: Pulmonary effort is normal.     Breath sounds: Normal breath sounds.  Chest:     Comments: Patient declined.  Abdominal:     General: Bowel sounds are normal.     Palpations: Abdomen is soft.  Genitourinary:    General: Normal vulva.     Vagina: Normal.     Cervix: Normal.     Uterus: Normal.      Adnexa: Right adnexa normal and left adnexa normal.     Comments: Curley Spice, CMA present.  Musculoskeletal:        General: Normal range of motion.     Right shoulder: Normal.     Left shoulder: Normal.     Right upper arm: Normal.     Left upper arm: Normal.     Right elbow: Normal.     Left elbow: Normal.     Right forearm: Normal.     Left forearm: Normal.     Right wrist: Normal.     Left wrist: Normal.     Right hand: Normal.     Left hand: Normal.     Cervical back: Normal, normal range of motion and neck supple.     Thoracic back: Normal.     Lumbar back: Normal.     Right hip: Normal.     Left hip: Normal.     Right upper leg: Normal.     Left upper leg: Normal.     Right knee: Normal.     Left knee: Normal.     Right lower leg: Normal.     Left lower leg: Normal.     Right ankle: Normal.     Left ankle: Normal.     Right foot: Normal.     Left foot: Normal.  Feet:     Right foot:     Skin integrity: Dry skin present.     Left foot:     Skin integrity: Dry skin present.  Skin:    General: Skin is warm and dry.  Neurological:     General: No focal deficit present.     Mental Status: She is alert and oriented to person, place, and time.  Psychiatric:        Mood and  Affect: Mood normal.        Behavior: Behavior  normal.     ASSESSMENT AND PLAN: 1. Annual physical exam - Counseled on 150 minutes of exercise per week as tolerated, healthy eating (including decreased daily intake of saturated fats, cholesterol, added sugars, sodium), STI prevention, and routine healthcare maintenance.  2. Screening for metabolic disorder - Routine screening.  - CMP14+EGFR  3. Screening for deficiency anemia - Routine screening.  - CBC  4. Screening cholesterol level - Routine screening.  - Lipid panel  5. Thyroid disorder screen - Routine screening.  - TSH  6. Encounter for screening mammogram for malignant neoplasm of breast - Keep all scheduled appointments.   7. Pap smear for cervical cancer screening - Routine screening.  - Cytology - PAP([facility])  8. Routine screening for STI (sexually transmitted infection) - Routine screening.  - Cervicovaginal ancillary only  9. Encounter for screening for HIV - Routine screening.  - HIV antibody (with reflex)  10. Colon cancer screening - Routine screening.  - Ambulatory referral to Gastroenterology  11. Type 2 diabetes mellitus without complication, without long-term current use of insulin (HCC) - Hemoglobin A1c not at goal at 7.4%, goal 7%. This is similar to previous 7.3%.  - Metformin XR discontinued per patient preference.  - Trial Glimepiride as prescribed. Counseled on medication adherence/adverse effects.  - Routine screening.  - Discussed the importance of healthy eating habits, low-carbohydrate diet, low-sugar diet, regular aerobic exercise (at least 150 minutes a week as tolerated) and medication compliance to achieve or maintain control of diabetes. - Follow-up with primary provider in 4 weeks or sooner if needed. - POCT glycosylated hemoglobin (Hb A1C) - Microalbumin / creatinine urine ratio - glimepiride (AMARYL) 1 MG tablet; Take 1 tablet (1 mg total) by mouth daily with breakfast.  Dispense: 30 tablet; Refill: 1  12. Encounter  for diabetic foot exam [facility]) - Referral to Podiatry for further evaluation/management.  - Ambulatory referral to Podiatry  13. Financial difficulties - Offered patient [employer] financial discount/orange card and blue card application. Patient agreeable.    Patient was given the opportunity to ask questions.  Patient verbalized understanding of the plan and was able to repeat key elements of the plan. Patient was given clear instructions to go to Emergency Department or return to medical center if symptoms don't improve, worsen, or new problems develop.The patient verbalized understanding.   Orders Placed This Encounter  Procedures   Microalbumin / creatinine urine ratio   CBC   Lipid panel   TSH   CMP14+EGFR   HIV antibody (with reflex)   Ambulatory referral to Gastroenterology   Ambulatory referral to Podiatry   POCT glycosylated hemoglobin (Hb A1C)     Requested Prescriptions   Signed Prescriptions Disp Refills   glimepiride (AMARYL) 1 MG tablet 30 tablet 1    Sig: Take 1 tablet (1 mg total) by mouth daily with breakfast.    Return in about 1 year (around 06/02/2024) for Physical per patient preference and 4 weeks or next available chronic care mgmt .  Rema Fendt, NP

## 2023-06-04 ENCOUNTER — Encounter: Payer: Self-pay | Admitting: *Deleted

## 2023-06-04 LAB — CYTOLOGY - PAP
Adequacy: ABSENT
Comment: NEGATIVE
Diagnosis: NEGATIVE
High risk HPV: NEGATIVE

## 2023-06-04 LAB — CMP14+EGFR
ALT: 14 IU/L (ref 0–32)
AST: 14 IU/L (ref 0–40)
Albumin: 4.2 g/dL (ref 3.8–4.9)
Alkaline Phosphatase: 65 IU/L (ref 44–121)
BUN/Creatinine Ratio: 8 — ABNORMAL LOW (ref 9–23)
BUN: 8 mg/dL (ref 6–24)
Bilirubin Total: 0.2 mg/dL (ref 0.0–1.2)
CO2: 21 mmol/L (ref 20–29)
Calcium: 9.3 mg/dL (ref 8.7–10.2)
Chloride: 102 mmol/L (ref 96–106)
Creatinine, Ser: 1.05 mg/dL — ABNORMAL HIGH (ref 0.57–1.00)
Globulin, Total: 2.8 g/dL (ref 1.5–4.5)
Glucose: 128 mg/dL — ABNORMAL HIGH (ref 70–99)
Potassium: 3.9 mmol/L (ref 3.5–5.2)
Sodium: 139 mmol/L (ref 134–144)
Total Protein: 7 g/dL (ref 6.0–8.5)
eGFR: 64 mL/min/{1.73_m2} (ref >59)

## 2023-06-04 LAB — CERVICOVAGINAL ANCILLARY ONLY
Bacterial Vaginitis (gardnerella): POSITIVE — AB
Candida Glabrata: NEGATIVE
Candida Vaginitis: NEGATIVE
Chlamydia: NEGATIVE
Comment: NEGATIVE
Comment: NEGATIVE
Comment: NEGATIVE
Comment: NEGATIVE
Comment: NEGATIVE
Comment: NORMAL
Neisseria Gonorrhea: NEGATIVE
Trichomonas: NEGATIVE

## 2023-06-04 LAB — CBC
Hematocrit: 38 % (ref 34.0–46.6)
Hemoglobin: 12.3 g/dL (ref 11.1–15.9)
MCH: 28.4 pg (ref 26.6–33.0)
MCHC: 32.4 g/dL (ref 31.5–35.7)
MCV: 88 fL (ref 79–97)
Platelets: 306 10*3/uL (ref 150–450)
RBC: 4.33 x10E6/uL (ref 3.77–5.28)
RDW: 12.9 % (ref 11.7–15.4)
WBC: 7.1 10*3/uL (ref 3.4–10.8)

## 2023-06-04 LAB — LIPID PANEL
Chol/HDL Ratio: 4.2 ratio (ref 0.0–4.4)
Cholesterol, Total: 158 mg/dL (ref 100–199)
HDL: 38 mg/dL — ABNORMAL LOW (ref >39)
LDL Chol Calc (NIH): 94 mg/dL (ref 0–99)
Triglycerides: 148 mg/dL (ref 0–149)
VLDL Cholesterol Cal: 26 mg/dL (ref 5–40)

## 2023-06-04 LAB — MICROALBUMIN / CREATININE URINE RATIO

## 2023-06-04 LAB — TSH: TSH: 2.77 u[IU]/mL (ref 0.450–4.500)

## 2023-06-04 LAB — HIV ANTIBODY (ROUTINE TESTING W REFLEX): HIV Screen 4th Generation wRfx: NONREACTIVE

## 2023-06-05 ENCOUNTER — Telehealth: Payer: Self-pay | Admitting: *Deleted

## 2023-06-05 ENCOUNTER — Other Ambulatory Visit: Payer: Self-pay | Admitting: Family Medicine

## 2023-06-05 MED ORDER — METRONIDAZOLE 500 MG PO TABS: 500.0000 mg | ORAL_TABLET | Freq: Two times a day (BID) | ORAL | 0 refills | Status: AC

## 2023-06-05 NOTE — Telephone Encounter (Signed)
Patient called back for results and notified:  Positive for BV. Will prescribe flagyl   Explained BV overgrowth to patient- understands and will take prescribed medication

## 2023-06-19 ENCOUNTER — Encounter: Payer: Self-pay | Admitting: Podiatry

## 2023-06-19 ENCOUNTER — Ambulatory Visit (INDEPENDENT_AMBULATORY_CARE_PROVIDER_SITE_OTHER): Payer: 59 | Admitting: Podiatry

## 2023-06-19 DIAGNOSIS — M79674 Pain in right toe(s): Secondary | ICD-10-CM

## 2023-06-19 DIAGNOSIS — B351 Tinea unguium: Secondary | ICD-10-CM

## 2023-06-19 DIAGNOSIS — E1142 Type 2 diabetes mellitus with diabetic polyneuropathy: Secondary | ICD-10-CM | POA: Diagnosis not present

## 2023-06-19 DIAGNOSIS — M79675 Pain in left toe(s): Secondary | ICD-10-CM | POA: Diagnosis not present

## 2023-06-19 MED ORDER — KETOCONAZOLE 2 % EX CREA: 1.0000 | TOPICAL_CREAM | Freq: Two times a day (BID) | CUTANEOUS | 0 refills | Status: DC

## 2023-06-19 NOTE — Progress Notes (Signed)
NA entered in erro

## 2023-06-19 NOTE — Progress Notes (Signed)
  Subjective:  Patient ID: Heather Roach, female    DOB: 1971/02/26,  MRN: [id number]  Chief Complaint  Patient presents with   Diabetes    "I have three toes on each foot that are black and they crack." N - toes black and crack L - bilateral 2-4 bilateral D - 6 mos O - gradually worse C - black, itch, crack A - sweat T - Lotrimin cream    52 y.o. female presents with concern for rash present on the third and fourth toes on both feet.  She has tried Lotrimin with no improvement.  She does notice that there is wetness in between the toes and and she does have moisture control problems with her feet.  Past Medical History:  Diagnosis Date   Hypertension     No Known Allergies  ROS: Negative except as per HPI above  Objective:  General: AAO x3, NAD  Dermatological: Attention directed to the third and fourth toes bilateral foot dorsal aspect there is dry flaking skin and xerosis of the skin with interdigital maceration without underlying ulceration.  Evidence of interdigital tinea pedis as well as dorsal toe fungal skin infection  Vascular:  Dorsalis Pedis artery and Posterior Tibial artery pedal pulses are 2/4 bilateral.  Capillary fill time < 3 sec to all digits.   Neruologic: Grossly intact via light touch bilateral. Protective threshold intact to all sites bilateral.   Musculoskeletal: No gross boney pedal deformities bilateral. No pain, crepitus, or limitation noted with foot and ankle range of motion bilateral. Muscular strength 5/5 in all groups tested bilateral.  Gait: Unassisted, Nonantalgic.   No images are attached to the encounter.   Assessment:   1. Pain due to onychomycosis of toenails of both feet   2. DM type 2 with diabetic peripheral neuropathy (HCC)      Plan:  Patient was evaluated and treated and all questions answered.  # Interdigital tinea pedis bilateral foot -Discussed use of Betadine paint in between the affected digits to decrease maceration  for 1 to 2 weeks Discussed the etiology and treatment options for tinea pedis.  Discussed topical and oral treatment.  Recommended topical treatment with 2% ketoconazole cream.  This was sent to the patient's pharmacy.  Also discussed appropriate foot hygiene, use of antifungal spray such as Tinactin in shoes, as well as cleaning her foot surfaces such as showers and bathroom floors with bleach.   Return in about 4 weeks (around 07/17/2023) for f/u tinea pedis.          Corinna Gab, DPM [facility] / [facility]

## 2023-07-01 ENCOUNTER — Ambulatory Visit (INDEPENDENT_AMBULATORY_CARE_PROVIDER_SITE_OTHER): Payer: 59 | Admitting: Family

## 2023-07-01 ENCOUNTER — Encounter: Payer: Self-pay | Admitting: Family

## 2023-07-01 VITALS — BP 162/98 | HR 73 | Temp 98.0°F | Ht 64.0 in | Wt 168.2 lb

## 2023-07-01 DIAGNOSIS — E1165 Type 2 diabetes mellitus with hyperglycemia: Secondary | ICD-10-CM | POA: Diagnosis not present

## 2023-07-01 DIAGNOSIS — Z7984 Long term (current) use of oral hypoglycemic drugs: Secondary | ICD-10-CM | POA: Diagnosis not present

## 2023-07-01 DIAGNOSIS — Z91148 Patient's other noncompliance with medication regimen for other reason: Secondary | ICD-10-CM

## 2023-07-01 NOTE — Progress Notes (Signed)
Patient ID: Heather Roach, female    DOB: [birth year]  MRN: [id number]  CC: Diabetes Follow-Up  Subjective: Heather Roach is a 52 y.o. female who presents for diabetes follow-up.  Her concerns today include:  - States she is not taking Amaryl due to she does "not like taking pills". She is not ready to trial diabetic injectable medications or insulin. She denies red flag symptoms associated with diabetes.  - Established with Cardiology for chronic conditions. She does not complain of red flag symptoms such as but not limited to chest pain, shortness of breath, worst headache of life, nausea/vomiting.  - No further issues/concerns for discussion today.   Patient Active Problem List   Diagnosis Date Noted   ERRONEOUS ENCOUNTER--DISREGARD 03/02/2023   Numbness and tingling in left arm 10/28/2022   Essential hypertension 02/21/2022   Tachycardia 02/21/2022   Elevated random blood glucose level 02/21/2022   Hypocalcemia 02/21/2022     Current Outpatient Medications on File Prior to Visit  Medication Sig Dispense Refill   Accu-Chek Softclix Lancets lancets Use as instructed 100 each 12   Blood Glucose Monitoring Suppl (ACCU-CHEK GUIDE) w/Device KIT 1 Units by Does not apply route daily. 1 kit 0   glucose blood (ACCU-CHEK GUIDE) test strip USE AS DIRECTED TWICE A DAY TO TEST BLOOD SUGARS 100 each 12   amLODipine (NORVASC) 5 MG tablet Take 1 tablet (5 mg total) by mouth daily. (Patient not taking: Reported on 06/03/2023) 30 tablet 2   chlorthalidone (HYGROTON) 25 MG tablet Take 1 tablet (25 mg total) by mouth daily. (Patient not taking: Reported on 02/10/2023) 30 tablet 2   diphenhydramine-acetaminophen (TYLENOL PM) 25-500 MG TABS tablet Take 1 tablet by mouth at bedtime as needed. (Patient not taking: Reported on 07/01/2023)     glimepiride (AMARYL) 1 MG tablet Take 1 tablet (1 mg total) by mouth daily with breakfast. (Patient not taking: Reported on 06/19/2023) 30 tablet 1   ketoconazole  (NIZORAL) 2 % cream Apply 1 Application topically 2 (two) times daily. Apply 0.5 gram to each foot 2x daily to affected areas (Patient not taking: Reported on 07/01/2023) 30 g 0   losartan (COZAAR) 50 MG tablet Take 1 tablet (50 mg total) by mouth daily. (Patient not taking: Reported on 02/10/2023) 30 tablet 2   No current facility-administered medications on file prior to visit.    No Known Allergies  Social History   Socioeconomic History   Marital status: Married    Spouse name: Not on file   Number of children: Not on file   Years of education: Not on file   Highest education level: Not on file  Occupational History   Not on file  Tobacco Use   Smoking status: Never    Passive exposure: Never   Smokeless tobacco: Never  Vaping Use   Vaping status: Never Used  Substance and Sexual Activity   Alcohol use: Not Currently   Drug use: Not Currently   Sexual activity: Yes  Other Topics Concern   Not on file  Social History Narrative   Not on file   Social Determinants of Health   Financial Resource Strain: Not on file  Food Insecurity: Not on file  Transportation Needs: Not on file  Physical Activity: Not on file  Stress: Not on file  Social Connections: Not on file  Intimate Partner Violence: Not on file    Family History  Problem Relation Age of Onset   Diabetes Mother    Heart  failure Mother    Hypertension Mother    Hypertension Sister    Cancer Sister    Sarcoidosis Sister    Diabetes Brother     Past Surgical History:  Procedure Laterality Date   NO PAST SURGERIES      ROS: Review of Systems Negative except as stated above  PHYSICAL EXAM: BP (!) 162/98   Pulse 73   Temp 98 F (36.7 C) (Oral)   Ht 5\' 4"  (1.626 m)   Wt 168 lb 3.2 oz (76.3 kg)   LMP 06/17/2023 (Exact Date)   SpO2 97%   BMI 28.87 kg/m   Physical Exam HENT:     Head: Normocephalic and atraumatic.     Nose: Nose normal.     Mouth/Throat:     Mouth: Mucous membranes are  moist.     Pharynx: Oropharynx is clear.  Eyes:     Extraocular Movements: Extraocular movements intact.     Conjunctiva/sclera: Conjunctivae normal.     Pupils: Pupils are equal, round, and reactive to light.  Cardiovascular:     Rate and Rhythm: Normal rate and regular rhythm.     Pulses: Normal pulses.     Heart sounds: Normal heart sounds.  Pulmonary:     Effort: Pulmonary effort is normal.     Breath sounds: Normal breath sounds.  Musculoskeletal:        General: Normal range of motion.     Cervical back: Normal range of motion and neck supple.  Neurological:     General: No focal deficit present.     Mental Status: She is alert and oriented to person, place, and time.  Psychiatric:        Mood and Affect: Mood normal.        Behavior: Behavior normal.     ASSESSMENT AND PLAN: 1. Type 2 diabetes mellitus with hyperglycemia, without long-term current use of insulin (HCC) 2. Nonadherence to medication - Hemoglobin A1c 7.4% on 06/03/2023. - Patient nonadherent to medication regimen.  - Discussed the importance of healthy eating habits, low-carbohydrate diet, low-sugar diet, regular aerobic exercise (at least 150 minutes a week as tolerated) and medication compliance to achieve or maintain control of diabetes. - Referral to Endocrinology for further evaluation/management. During the interim follow-up with primary provider as scheduled.  - Ambulatory referral to Endocrinology   Patient was given the opportunity to ask questions.  Patient verbalized understanding of the plan and was able to repeat key elements of the plan. Patient was given clear instructions to go to Emergency Department or return to medical center if symptoms don't improve, worsen, or new problems develop.The patient verbalized understanding.   Orders Placed This Encounter  Procedures   Ambulatory referral to Endocrinology    Return in about 8 weeks (around 08/26/2023) for Follow-Up or next available chronic  conditions .  Rema Fendt, NP

## 2023-07-17 ENCOUNTER — Ambulatory Visit: Payer: 59 | Admitting: Podiatry

## 2023-08-27 ENCOUNTER — Telehealth: Payer: Self-pay | Admitting: Family

## 2023-08-27 ENCOUNTER — Encounter: Payer: 59 | Admitting: Family

## 2023-08-27 NOTE — Telephone Encounter (Signed)
Called patient to reschedule missed appointment.

## 2023-08-27 NOTE — Progress Notes (Signed)
Erroneous encounter-disregard

## 2023-09-16 ENCOUNTER — Ambulatory Visit: Payer: 59 | Admitting: Nurse Practitioner

## 2023-09-16 ENCOUNTER — Telehealth: Payer: Self-pay | Admitting: Family

## 2023-09-16 ENCOUNTER — Encounter: Payer: 59 | Admitting: Family

## 2023-09-16 DIAGNOSIS — E782 Mixed hyperlipidemia: Secondary | ICD-10-CM

## 2023-09-16 DIAGNOSIS — I10 Essential (primary) hypertension: Secondary | ICD-10-CM

## 2023-09-16 DIAGNOSIS — E1165 Type 2 diabetes mellitus with hyperglycemia: Secondary | ICD-10-CM

## 2023-09-16 NOTE — Progress Notes (Signed)
Erroneous encounter-disregard

## 2023-09-16 NOTE — Telephone Encounter (Signed)
Called patient to reschedule missed appointment no answer left message.

## 2023-09-16 NOTE — Progress Notes (Deleted)
Endocrinology Consult Note       09/16/2023, 7:14 AM   Subjective:    Patient ID: Heather Roach, female    DOB: Apr 26, 1971.  Clary Dobson is being seen in consultation for management of currently uncontrolled symptomatic diabetes requested by  Rema Fendt, NP.   Past Medical History:  Diagnosis Date   Hypertension     Past Surgical History:  Procedure Laterality Date   NO PAST SURGERIES      Social History   Socioeconomic History   Marital status: Married    Spouse name: Not on file   Number of children: Not on file   Years of education: Not on file   Highest education level: Not on file  Occupational History   Not on file  Tobacco Use   Smoking status: Never    Passive exposure: Never   Smokeless tobacco: Never  Vaping Use   Vaping status: Never Used  Substance and Sexual Activity   Alcohol use: Not Currently   Drug use: Not Currently   Sexual activity: Yes  Other Topics Concern   Not on file  Social History Narrative   Not on file   Social Determinants of Health   Financial Resource Strain: Not on file  Food Insecurity: Not on file  Transportation Needs: Not on file  Physical Activity: Not on file  Stress: Not on file  Social Connections: Not on file    Family History  Problem Relation Age of Onset   Diabetes Mother    Heart failure Mother    Hypertension Mother    Hypertension Sister    Cancer Sister    Sarcoidosis Sister    Diabetes Brother     Outpatient Encounter Medications as of 09/16/2023  Medication Sig   Accu-Chek Softclix Lancets lancets Use as instructed   amLODipine (NORVASC) 5 MG tablet Take 1 tablet (5 mg total) by mouth daily. (Patient not taking: Reported on 06/03/2023)   Blood Glucose Monitoring Suppl (ACCU-CHEK GUIDE) w/Device KIT 1 Units by Does not apply route daily.   chlorthalidone (HYGROTON) 25 MG tablet Take 1 tablet (25 mg total) by mouth daily.  (Patient not taking: Reported on 02/10/2023)   diphenhydramine-acetaminophen (TYLENOL PM) 25-500 MG TABS tablet Take 1 tablet by mouth at bedtime as needed. (Patient not taking: Reported on 07/01/2023)   glimepiride (AMARYL) 1 MG tablet Take 1 tablet (1 mg total) by mouth daily with breakfast. (Patient not taking: Reported on 06/19/2023)   glucose blood (ACCU-CHEK GUIDE) test strip USE AS DIRECTED TWICE A DAY TO TEST BLOOD SUGARS   ketoconazole (NIZORAL) 2 % cream Apply 1 Application topically 2 (two) times daily. Apply 0.5 gram to each foot 2x daily to affected areas (Patient not taking: Reported on 07/01/2023)   losartan (COZAAR) 50 MG tablet Take 1 tablet (50 mg total) by mouth daily. (Patient not taking: Reported on 02/10/2023)   No facility-administered encounter medications on file as of 09/16/2023.    ALLERGIES: No Known Allergies  VACCINATION STATUS:  There is no immunization history on file for this patient.  Diabetes She presents for her initial diabetic visit. She has type 2 diabetes mellitus. Her disease course has been  stable. There are no hypoglycemic associated symptoms. There are no diabetic associated symptoms. There are no hypoglycemic complications. There are no diabetic complications. Risk factors for coronary artery disease include diabetes mellitus, family history and hypertension. When asked about current treatments, none (says she does not like taking medications or checking glucose per referral packet) were reported. Her weight is stable. She is following a generally unhealthy diet. When asked about meal planning, she reported none. She has not had a previous visit with a dietitian. She participates in exercise intermittently. An ACE inhibitor/angiotensin II receptor blocker is not being taken. She does not see a podiatrist.Eye exam is current.     Review of systems  Constitutional: + Minimally fluctuating body weight, current There is no height or weight on file to  calculate BMI., no fatigue, no subjective hyperthermia, no subjective hypothermia Eyes: no blurry vision, no xerophthalmia ENT: no sore throat, no nodules palpated in throat, no dysphagia/odynophagia, no hoarseness Cardiovascular: no chest pain, no shortness of breath, no palpitations, no leg swelling Respiratory: no cough, no shortness of breath Gastrointestinal: no nausea/vomiting/diarrhea Musculoskeletal: no muscle/joint aches Skin: no rashes, no hyperemia Neurological: no tremors, no numbness, no tingling, no dizziness Psychiatric: no depression, no anxiety  Objective:     There were no vitals taken for this visit.  Wt Readings from Last 3 Encounters:  07/01/23 168 lb 3.2 oz (76.3 kg)  06/03/23 168 lb 3.2 oz (76.3 kg)  02/10/23 169 lb (76.7 kg)     BP Readings from Last 3 Encounters:  07/01/23 (!) 162/98  06/03/23 (!) 152/87  02/10/23 (!) 165/102     Physical Exam- Limited  Constitutional:  There is no height or weight on file to calculate BMI. , not in acute distress, normal state of mind Eyes:  EOMI, no exophthalmos Neck: Supple Cardiovascular: RRR, no murmurs, rubs, or gallops, no edema Respiratory: Adequate breathing efforts, no crackles, rales, rhonchi, or wheezing Musculoskeletal: no gross deformities, strength intact in all four extremities, no gross restriction of joint movements Skin:  no rashes, no hyperemia Neurological: no tremor with outstretched hands   Diabetic Foot Exam - Simple   No data filed      CMP ( most recent) CMP     Component Value Date/Time   NA 139 06/03/2023 0000   K 3.9 06/03/2023 0000   CL 102 06/03/2023 0000   CO2 21 06/03/2023 0000   GLUCOSE 128 (H) 06/03/2023 0000   GLUCOSE 137 (H) 01/12/2023 2013   BUN 8 06/03/2023 0000   CREATININE 1.05 (H) 06/03/2023 0000   CALCIUM 9.3 06/03/2023 0000   PROT 7.0 06/03/2023 0000   ALBUMIN 4.2 06/03/2023 0000   AST 14 06/03/2023 0000   ALT 14 06/03/2023 0000   ALKPHOS 65 06/03/2023  0000   BILITOT 0.2 06/03/2023 0000   EGFR 64 06/03/2023 0000   GFRNONAA >60 01/12/2023 2013     Diabetic Labs (most recent): Lab Results  Component Value Date   HGBA1C 7.4 (A) 06/03/2023   HGBA1C 7.3 (A) 11/21/2022   HGBA1C 7.0 (A) 08/22/2022     Lipid Panel ( most recent) Lipid Panel     Component Value Date/Time   CHOL 158 06/03/2023 0000   TRIG 148 06/03/2023 0000   HDL 38 (L) 06/03/2023 0000   CHOLHDL 4.2 06/03/2023 0000   LDLCALC 94 06/03/2023 0000   LABVLDL 26 06/03/2023 0000      Lab Results  Component Value Date   TSH 2.770 06/03/2023   TSH  4.440 02/24/2022           Assessment & Plan:   1) Type 2 diabetes mellitus with hyperglycemia, without long-term current use of insulin (HCC)    - Bridgitt Wardell has currently uncontrolled symptomatic type 2 DM since *** years of age, with most recent A1c of *** %.   -Recent labs reviewed.  - I had a long discussion with her about the progressive nature of diabetes and the pathology behind its complications. -her diabetes is complicated by *** and she remains at a high risk for more acute and chronic complications which include CAD, CVA, CKD, retinopathy, and neuropathy. These are all discussed in detail with her.  The following Lifestyle Medicine recommendations according to American College of Lifestyle Medicine [facility]) were discussed and offered to patient and she agrees to start the journey:  A. Whole Foods, Plant-based plate comprising of fruits and vegetables, plant-based proteins, whole-grain carbohydrates was discussed in detail with the patient.   A list for source of those nutrients were also provided to the patient.  Patient will use only water or unsweetened tea for hydration. B.  The need to stay away from risky substances including alcohol, smoking; obtaining 7 to 9 hours of restorative sleep, at least 150 minutes of moderate intensity exercise weekly, the importance of healthy social connections,  and  stress reduction techniques were discussed. C.  A full color page of  Calorie density of various food groups per pound showing examples of each food groups was provided to the patient.  - I have counseled her on diet and weight management by adopting a carbohydrate restricted/protein rich diet. Patient is encouraged to switch to unprocessed or minimally processed complex starch and increased protein intake (animal or plant source), fruits, and vegetables. -  she is advised to stick to a routine mealtimes to eat 3 meals a day and avoid unnecessary snacks (to snack only to correct hypoglycemia).   - she acknowledges that there is a room for improvement in her food and drink choices. - Suggestion is made for her to avoid simple carbohydrates from her diet including Cakes, Sweet Desserts, Ice Cream, Soda (diet and regular), Sweet Tea, Candies, Chips, Cookies, Store Bought Juices, Alcohol in Excess of 1-2 drinks a day, Artificial Sweeteners, Coffee Creamer, and "Sugar-free" Products. This will help patient to have more stable blood glucose profile and potentially avoid unintended weight gain.  - she will be scheduled with Norm Salt, RDN, CDE for diabetes education.  - I have approached her with the following individualized plan to manage her diabetes and patient agrees:    -she is encouraged to start monitoring glucose at least once daily, before breakfast, to log their readings on the clinic sheets provided, and bring them to review at follow up appointment in 3 months.  - Adjustment parameters are given to her for hypo and hyperglycemia in writing. - she is encouraged to call clinic for blood glucose levels less than 70 or above 300 mg /dl. - she is advised to continue ***, therapeutically suitable for patient . - her *** will be discontinued, risk outweighs benefit for this patient.  - she will be considered for incretin therapy as appropriate next visit.  - Specific targets for  A1c; LDL,  HDL, and Triglycerides were discussed with the patient.  2) Blood Pressure /Hypertension:  her blood pressure is controlled to target.  She is not currently taking any antihypertensive medications at this time.  she is advised to continue her  current medications including *** mg p.o. daily with breakfast.  3) Lipids/Hyperlipidemia:    Review of her recent lipid panel from 06/03/23 showed controlled LDL at 94 .   She is not currently taking any lipid lowering medications at this time.  she is advised to continue *** mg daily at bedtime.  Side effects and precautions discussed with her.  4)  Weight/Diet:  her There is no height or weight on file to calculate BMI.  -  *** clearly complicating her diabetes care.   she is *** a candidate for weight loss. I discussed with her the fact that loss of 5 - 10% of her  current body weight will have the most impact on her diabetes management.  Exercise, and detailed carbohydrates information provided  -  detailed on discharge instructions.  5) Chronic Care/Health Maintenance: -she is not on ACEI/ARB or Statin medications and is encouraged to initiate and continue to follow up with Ophthalmology, Dentist, Podiatrist at least yearly or according to recommendations, and advised to *** stay away from smoking. I have recommended yearly flu vaccine and pneumonia vaccine at least every 5 years; moderate intensity exercise for up to 150 minutes weekly; and sleep for at least 7 hours a day.  - she is advised to maintain close follow up with Rema Fendt, NP for primary care needs, as well as her other providers for optimal and coordinated care.   - Time spent in this patient care: 60 min, of which > 50% was spent in counseling her about her diabetes and the rest reviewing her blood glucose logs, discussing her hypoglycemia and hyperglycemia episodes, reviewing her current and previous labs/studies (including abstraction from other facilities) and medications doses and  developing a long term treatment plan based on the latest standards of care/guidelines; and documenting her care.    Please refer to Patient Instructions for Blood Glucose Monitoring and Insulin/Medications Dosing Guide" in media tab for additional information. Please also refer to "Patient Self Inventory" in the Media tab for reviewed elements of pertinent patient history.  Jacqualyn Laudicina participated in the discussions, expressed understanding, and voiced agreement with the above plans.  All questions were answered to her satisfaction. she is encouraged to contact clinic should she have any questions or concerns prior to her return visit.     Follow up plan: - No follow-ups on file.    Ronny Bacon, [facility] [address] [place], [place] [address] Phone: [phone] Fax: [phone]  09/16/2023, 7:14 AM

## 2023-09-24 ENCOUNTER — Encounter (HOSPITAL_COMMUNITY): Payer: Self-pay | Admitting: Radiology

## 2023-09-24 ENCOUNTER — Emergency Department (HOSPITAL_COMMUNITY)
Admission: EM | Admit: 2023-09-24 | Discharge: 2023-09-24 | Disposition: A | Discharge status: Home / Self Care | Payer: 59 | Attending: Emergency Medicine | Admitting: Emergency Medicine

## 2023-09-24 ENCOUNTER — Emergency Department (HOSPITAL_COMMUNITY): Payer: 59

## 2023-09-24 ENCOUNTER — Other Ambulatory Visit: Payer: Self-pay

## 2023-09-24 DIAGNOSIS — Z79899 Other long term (current) drug therapy: Secondary | ICD-10-CM | POA: Insufficient documentation

## 2023-09-24 DIAGNOSIS — J3489 Other specified disorders of nose and nasal sinuses: Secondary | ICD-10-CM | POA: Insufficient documentation

## 2023-09-24 DIAGNOSIS — R0981 Nasal congestion: Secondary | ICD-10-CM | POA: Diagnosis not present

## 2023-09-24 DIAGNOSIS — Z20822 Contact with and (suspected) exposure to covid-19: Secondary | ICD-10-CM | POA: Diagnosis not present

## 2023-09-24 DIAGNOSIS — R519 Headache, unspecified: Secondary | ICD-10-CM | POA: Diagnosis not present

## 2023-09-24 DIAGNOSIS — I10 Essential (primary) hypertension: Secondary | ICD-10-CM | POA: Diagnosis not present

## 2023-09-24 LAB — BASIC METABOLIC PANEL
Anion gap: 8 (ref 5–15)
BUN: 13 mg/dL (ref 6–20)
CO2: 24 mmol/L (ref 22–32)
Calcium: 8.4 mg/dL — ABNORMAL LOW (ref 8.9–10.3)
Chloride: 104 mmol/L (ref 98–111)
Creatinine, Ser: 0.87 mg/dL (ref 0.44–1.00)
GFR, Estimated: >60 mL/min (ref >60)
Glucose, Bld: 117 mg/dL — ABNORMAL HIGH (ref 70–99)
Potassium: 3.5 mmol/L (ref 3.5–5.1)
Sodium: 136 mmol/L (ref 135–145)

## 2023-09-24 LAB — CBC WITH DIFFERENTIAL/PLATELET
Abs Immature Granulocytes: 0.02 10*3/uL (ref 0.00–0.07)
Basophils Absolute: 0 10*3/uL (ref 0.0–0.1)
Basophils Relative: 1 %
Eosinophils Absolute: 0.2 10*3/uL (ref 0.0–0.5)
Eosinophils Relative: 3 %
HCT: 36.9 % (ref 36.0–46.0)
Hemoglobin: 12.3 g/dL (ref 12.0–15.0)
Immature Granulocytes: 0 %
Lymphocytes Relative: 35 %
Lymphs Abs: 2.6 10*3/uL (ref 0.7–4.0)
MCH: 29.2 pg (ref 26.0–34.0)
MCHC: 33.3 g/dL (ref 30.0–36.0)
MCV: 87.6 fL (ref 80.0–100.0)
Monocytes Absolute: 0.8 10*3/uL (ref 0.1–1.0)
Monocytes Relative: 11 %
Neutro Abs: 3.7 10*3/uL (ref 1.7–7.7)
Neutrophils Relative %: 50 %
Platelets: 271 10*3/uL (ref 150–400)
RBC: 4.21 MIL/uL (ref 3.87–5.11)
RDW: 12.8 % (ref 11.5–15.5)
WBC: 7.3 10*3/uL (ref 4.0–10.5)
nRBC: 0 % (ref 0.0–0.2)

## 2023-09-24 LAB — RESP PANEL BY RT-PCR (RSV, FLU A&B, COVID)  RVPGX2
Influenza A by PCR: NEGATIVE
Influenza B by PCR: NEGATIVE
Resp Syncytial Virus by PCR: NEGATIVE
SARS Coronavirus 2 by RT PCR: NEGATIVE

## 2023-09-24 LAB — SEDIMENTATION RATE: Sed Rate: 15 mm/h (ref 0–22)

## 2023-09-24 MED ORDER — KETOROLAC TROMETHAMINE 30 MG/ML IJ SOLN
30.0000 mg | Freq: Once | INTRAMUSCULAR | Status: AC
  Administered 2023-09-24: 30 mg via INTRAVENOUS
  Filled 2023-09-24: qty 1

## 2023-09-24 MED ORDER — DIPHENHYDRAMINE HCL 50 MG/ML IJ SOLN
25.0000 mg | Freq: Once | INTRAMUSCULAR | Status: AC
  Administered 2023-09-24: 25 mg via INTRAVENOUS
  Filled 2023-09-24: qty 1

## 2023-09-24 MED ORDER — CHLORTHALIDONE 25 MG PO TABS
25.0000 mg | ORAL_TABLET | Freq: Every day | ORAL | Status: DC
  Administered 2023-09-24: 25 mg via ORAL
  Filled 2023-09-24: qty 1

## 2023-09-24 MED ORDER — METOCLOPRAMIDE HCL 5 MG/ML IJ SOLN
10.0000 mg | Freq: Once | INTRAMUSCULAR | Status: AC
  Administered 2023-09-24: 10 mg via INTRAVENOUS
  Filled 2023-09-24: qty 2

## 2023-09-24 MED ORDER — AMLODIPINE BESYLATE 5 MG PO TABS
5.0000 mg | ORAL_TABLET | Freq: Once | ORAL | Status: AC
  Administered 2023-09-24: 5 mg via ORAL
  Filled 2023-09-24: qty 1

## 2023-09-24 NOTE — ED Provider Notes (Signed)
EMERGENCY DEPARTMENT AT [facility] Provider Note   CSN: [id number] Arrival date & time: 09/24/23  1658     History  Chief Complaint  Patient presents with   Headache    Heather Roach is a 52 y.o. female.   Headache Associated symptoms: congestion   Associated symptoms: no abdominal pain, no back pain, no dizziness, no nausea, no neck pain, no neck stiffness, no seizures and no vomiting        Heather Roach is a 52 y.o. female past medical tree of hypertension who presents to the Emergency Department complaining of right-sided headache for 2 days.  Also endorses some nasal congestion and rhinorrhea.  No fever or chills.  Denies any cough abdominal pain chest pain or shortness of breath.  Describes headache as located at her temple and describes as sharp stabbing pain that has been constant since onset.  Endorses history of migraine headaches but states headache pain feels different from previous migraines.  Denies any dizziness or visual changes.  No neck pain nausea or vomiting.  Hypertensive on arrival, patient does take blood pressure medications but states that she does not take them consistently.  Admits to recent increase stressors due to recent death of 2 family members  Home Medications Prior to Admission medications   Medication Sig Start Date End Date Taking? Authorizing Provider  Accu-Chek Softclix Lancets lancets Use as instructed 02/26/22   Mayers, Cari S, PA-C  amLODipine (NORVASC) 5 MG tablet Take 1 tablet (5 mg total) by mouth daily. Patient not taking: Reported on 06/03/2023 10/28/22   Mallipeddi, Vishnu P, MD  Blood Glucose Monitoring Suppl (ACCU-CHEK GUIDE) w/Device KIT 1 Units by Does not apply route daily. 02/26/22   Mayers, Cari S, PA-C  chlorthalidone (HYGROTON) 25 MG tablet Take 1 tablet (25 mg total) by mouth daily. Patient not taking: Reported on 02/10/2023 10/28/22   Mallipeddi, Vishnu P, MD  diphenhydramine-acetaminophen (TYLENOL PM)  25-500 MG TABS tablet Take 1 tablet by mouth at bedtime as needed. Patient not taking: Reported on 07/01/2023    [provider]  glimepiride (AMARYL) 1 MG tablet Take 1 tablet (1 mg total) by mouth daily with breakfast. Patient not taking: Reported on 06/19/2023 06/03/23   Rema Fendt, NP  glucose blood (ACCU-CHEK GUIDE) test strip USE AS DIRECTED TWICE A DAY TO TEST BLOOD SUGARS 02/26/22   Mayers, Cari S, PA-C  ketoconazole (NIZORAL) 2 % cream Apply 1 Application topically 2 (two) times daily. Apply 0.5 gram to each foot 2x daily to affected areas Patient not taking: Reported on 07/01/2023 06/19/23   Standiford, Jenelle Mages, DPM  losartan (COZAAR) 50 MG tablet Take 1 tablet (50 mg total) by mouth daily. Patient not taking: Reported on 02/10/2023 10/28/22   Mallipeddi, Orion Modest, MD      Allergies    Patient has no known allergies.    Review of Systems   Review of Systems  Constitutional:  Negative for chills.  HENT:  Positive for congestion and rhinorrhea. Negative for trouble swallowing.   Eyes:  Negative for visual disturbance.  Respiratory:  Negative for shortness of breath.   Cardiovascular:  Negative for chest pain.  Gastrointestinal:  Negative for abdominal pain, nausea and vomiting.  Genitourinary:  Negative for dysuria.  Musculoskeletal:  Negative for back pain, neck pain and neck stiffness.  Neurological:  Positive for headaches. Negative for dizziness, seizures, syncope, facial asymmetry and speech difficulty.    Physical Exam Updated Vital Signs BP Marland Kitchen)  155/114   Pulse 85   Temp 99.5 F (37.5 C) (Oral)   Resp 16   Ht 5\' 4"  (1.626 m)   Wt 76.2 kg   SpO2 99%   BMI 28.84 kg/m  Physical Exam Vitals and nursing note reviewed.  Constitutional:      General: She is not in acute distress.    Appearance: She is well-developed. She is not ill-appearing or toxic-appearing.  Eyes:     Extraocular Movements: Extraocular movements intact.     Pupils: Pupils are equal,  round, and reactive to light.  Neck:     Meningeal: Kernig's sign absent.  Cardiovascular:     Rate and Rhythm: Normal rate and regular rhythm.     Pulses: Normal pulses.  Pulmonary:     Effort: Pulmonary effort is normal. No respiratory distress.  Abdominal:     Palpations: Abdomen is soft.     Tenderness: There is no abdominal tenderness.  Musculoskeletal:     Cervical back: Normal range of motion. No rigidity or tenderness.  Lymphadenopathy:     Cervical: No cervical adenopathy.  Skin:    General: Skin is warm.     Capillary Refill: Capillary refill takes less than 2 seconds.  Neurological:     General: No focal deficit present.     Mental Status: She is alert.     Sensory: Sensation is intact. No sensory deficit.     Motor: Motor function is intact. No weakness.     Comments: CN III to XII grossly intact.  Speech clear.  No pronator drift.  No facial droop     ED Results / Procedures / Treatments   Labs (all labs ordered are listed, but only abnormal results are displayed) Labs Reviewed  BASIC METABOLIC PANEL - Abnormal; Notable for the following components:      Result Value   Glucose, Bld 117 (*)    Calcium 8.4 (*)    All other components within normal limits  RESP PANEL BY RT-PCR (RSV, FLU A&B, COVID)  RVPGX2  SEDIMENTATION RATE  CBC WITH DIFFERENTIAL/PLATELET  C-REACTIVE PROTEIN    EKG None  Radiology CT Head Wo Contrast  Result Date: 09/24/2023 CLINICAL DATA:  Headache, new onset (Age >= 51y) EXAM: CT HEAD WITHOUT CONTRAST TECHNIQUE: Contiguous axial images were obtained from the base of the skull through the vertex without intravenous contrast. RADIATION DOSE REDUCTION: This exam was performed according to the departmental dose-optimization program which includes automated exposure control, adjustment of the mA and/or kV according to patient size and/or use of iterative reconstruction technique. COMPARISON:  Head CT 02/11/2022 FINDINGS: Brain: No intracranial  hemorrhage, mass effect, or midline shift. No hydrocephalus. The basilar cisterns are patent. No evidence of territorial infarct or acute ischemia. No extra-axial or intracranial fluid collection. Vascular: No hyperdense vessel or unexpected calcification. Skull: Normal. Negative for fracture or focal lesion. Sinuses/Orbits: Paranasal sinuses and mastoid air cells are clear. The visualized orbits are unremarkable. Other: None. IMPRESSION: Normal head CT.  No explanation for headache. Electronically Signed   By: Narda Rutherford M.D.   On: 09/24/2023 21:28    Procedures Procedures    Medications Ordered in ED Medications  chlorthalidone (HYGROTON) tablet 25 mg (25 mg Oral Given 09/24/23 2202)  ketorolac (TORADOL) 30 MG/ML injection 30 mg (30 mg Intravenous Given 09/24/23 1952)  diphenhydrAMINE (BENADRYL) injection 25 mg (25 mg Intravenous Given 09/24/23 1952)  metoCLOPramide (REGLAN) injection 10 mg (10 mg Intravenous Given 09/24/23 1952)  amLODipine (NORVASC) tablet  5 mg (5 mg Oral Given 09/24/23 2202)    ED Course/ Medical Decision Making/ A&P                                 Medical Decision Making Patient here with persistent headache x 2 days.  Endorses nasal congestion and rhinorrhea as well no fever or chills.  Headache described as sharp and stabbing in quality and localized to right temple area.  No visual changes dizziness chest pain or syncope.  On exam, patient noted to be significantly hypertensive, has history of same.  Admits to noncompliance with taking her antihypertensive medications.  I do not appreciate any focal neurodeficits on exam.  Differential would include but not limited to atypical migraine, SAH, tension headache, headache secondary to hypertension, COVID   Amount and/or Complexity of Data Reviewed Labs: ordered.    Details: Labs interpreted by me, unremarkable Radiology: ordered.    Details: CT head without acute intracranial finding Discussion of management or  test interpretation with external provider(s): Patient given IV migraine cocktail here, on recheck patient sitting up on the bed reports improvement of headache pain.  Workup this evening reassuring.  No evidence of endorgan damage.  Hypertensive has not taken her medications today.  Dose given here.  She also has been under increased stress recently due to death of family members.  Counseled on importance of maintaining proper blood pressure control.  Patient agreeable to resume blood pressure medications daily will follow-up closely with PCP for recheck.  Feeling better.  Appears appropriate for discharge home.  Risk Prescription drug management.           Final Clinical Impression(s) / ED Diagnoses Final diagnoses:  Hypertension, unspecified type  Headache disorder    Rx / DC Orders ED Discharge Orders     None         Rosey Bath 09/24/23 2228    Jacalyn Lefevre, MD 09/25/23 1555

## 2023-09-24 NOTE — ED Provider Triage Note (Signed)
Emergency Medicine Provider Triage Evaluation Note  Heather Roach , a 52 y.o. female  was evaluated in triage.  Pt complains of severe pain right temple onset yesterday, no changes in vision/speech/gait. Hx of migraines, does not feel like prior migraines.   Review of Systems  Positive: headache Negative: Fever, vomiting  Physical Exam  BP (!) 155/110   Pulse 93   Temp 99.5 F (37.5 C) (Oral)   Resp 17   Ht 5\' 4"  (1.626 m)   Wt 76.2 kg   SpO2 98%   BMI 28.84 kg/m  Gen:   Awake, no distress   Resp:  Normal effort  MSK:   Moves extremities without difficulty  Other:  TTP right temple   Medical Decision Making  Medically screening exam initiated at 5:57 PM.  Appropriate orders placed.  Catlin Doria was informed that the remainder of the evaluation will be completed by another provider, this initial triage assessment does not replace that evaluation, and the importance of remaining in the ED until their evaluation is complete.     Jeannie Fend, PA-C 09/24/23 1758

## 2023-09-24 NOTE — Discharge Instructions (Signed)
As discussed, it is very important that you take your blood pressure medications daily as directed.  Try not to miss any doses.  You may want to monitor your blood pressure at home as well and record a log to take with you when you follow-up with your primary care provider.  Please arrange follow-up appointment for next week.  Return to the emergency department for any new or worsening symptoms.

## 2023-09-24 NOTE — ED Notes (Signed)
Pt back from CT Reattached to partial monitor

## 2023-09-24 NOTE — ED Notes (Signed)
Medicated per MAR Pt off to CT

## 2023-09-24 NOTE — ED Triage Notes (Signed)
Pt states for the past 2 days she has sharp stabbing pain on the right side of her head. Pt states she has had a recent head cold. Has taken tylenol once for the pain without relief. Pt states she normally has migraines but this pain is different.

## 2023-09-25 LAB — C-REACTIVE PROTEIN: CRP: 1.2 mg/dL — ABNORMAL HIGH (ref <1.0)

## 2023-10-12 ENCOUNTER — Telehealth: Payer: Self-pay

## 2023-10-12 NOTE — Telephone Encounter (Signed)
Transition Care Management Unsuccessful Follow-up Telephone Call  Date of discharge and from where:  Heather Roach 11/7  Attempts:  1st Attempt  Reason for unsuccessful TCM follow-up call:  No answer/busy   [name]  [facility], [facility] Guide, Phone: [phone] Website: [email or web address]

## 2023-10-13 ENCOUNTER — Telehealth: Payer: Self-pay

## 2023-10-13 NOTE — Telephone Encounter (Signed)
Transition Care Management Unsuccessful Follow-up Telephone Call  Date of discharge and from where:  [facility]  Attempts:  2nd Attempt  Reason for unsuccessful TCM follow-up call:  No answer/busy   Lenard Forth   [facility], [facility] Guide, Phone: [phone] Website: [email or web address]

## 2023-10-23 IMAGING — MG MM DIGITAL SCREENING BILAT W/ TOMO AND CAD
8 series · 8 of 24 positions shown · non-contrast
Comparison: None available.

CLINICAL DATA: Screening.

EXAM:
DIGITAL SCREENING BILATERAL MAMMOGRAM WITH TOMOSYNTHESIS AND CAD
TECHNIQUE: Bilateral screening digital craniocaudal and mediolateral oblique
mammograms were obtained. Bilateral screening digital breast
tomosynthesis was performed. The images were evaluated with
computer-aided detection.

[R CC synth-2D]
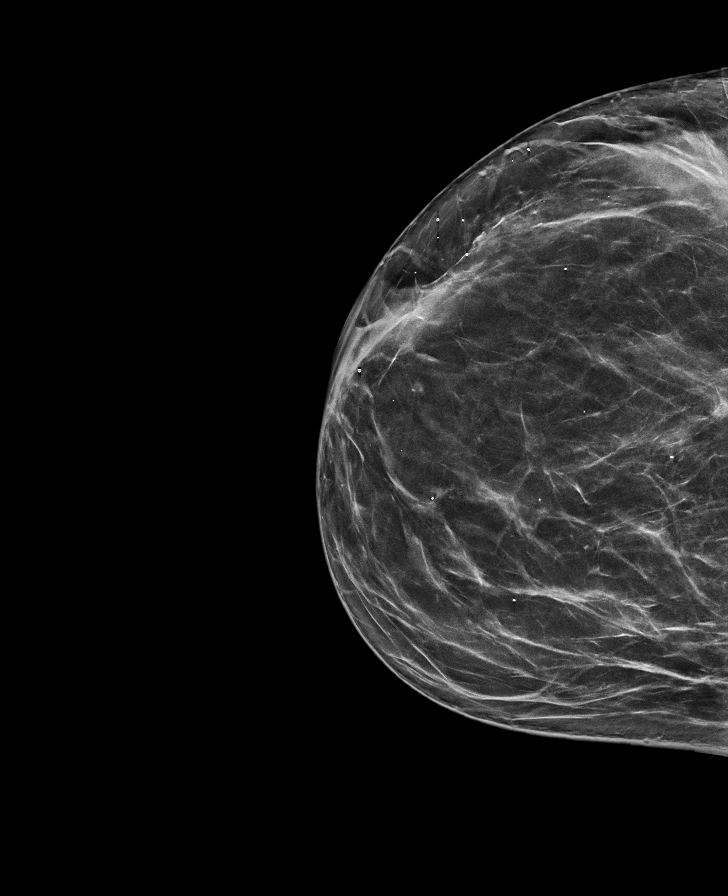

[R MLO synth-2D]
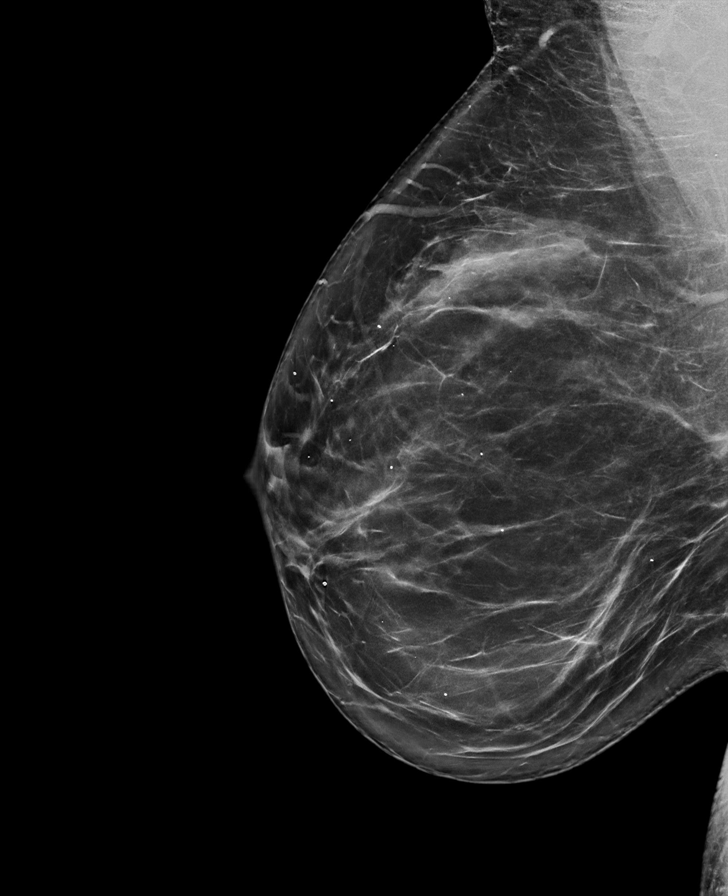

[L CC synth-2D]
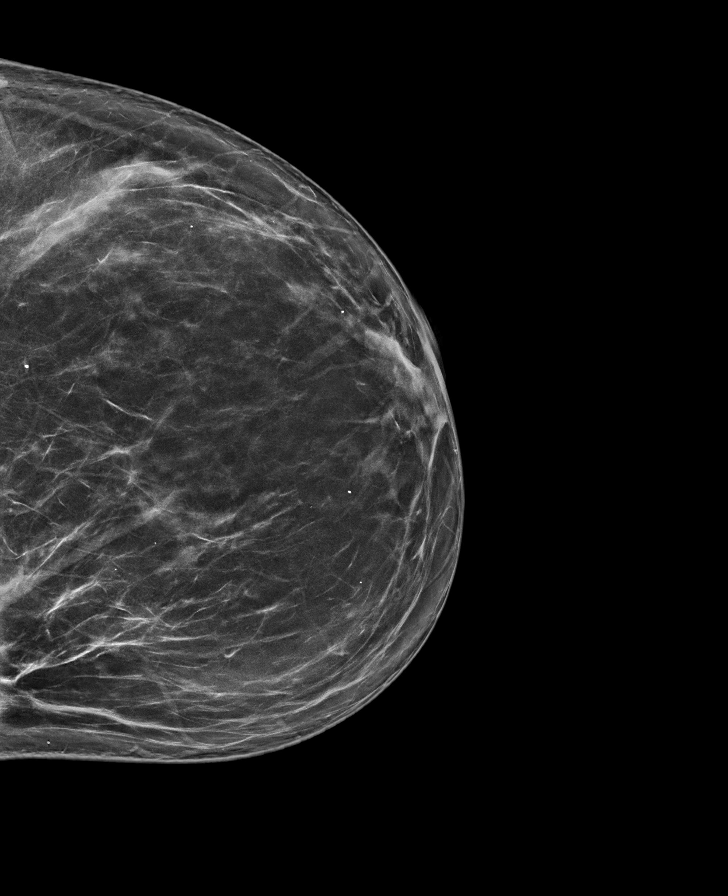

[L MLO synth-2D]
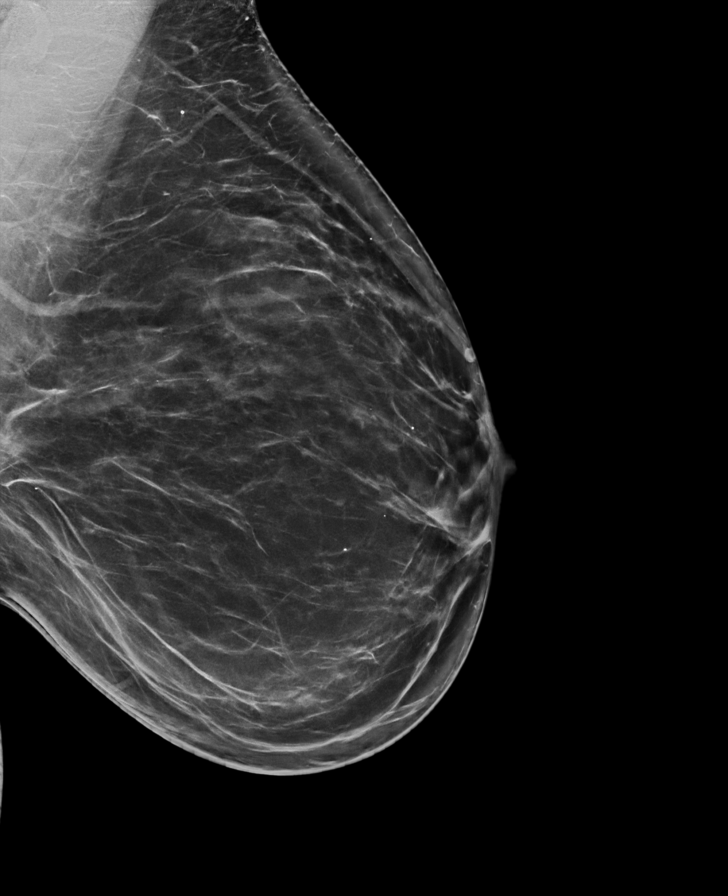

[R CC tomo · tomo slice 41/82.0]
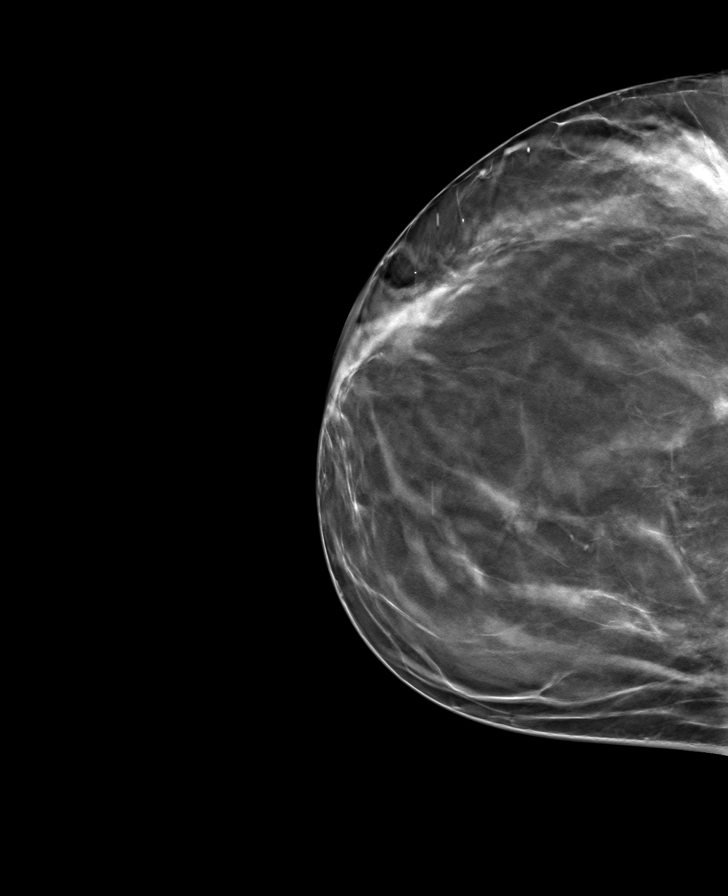

[R MLO tomo · tomo slice 47/94.0]
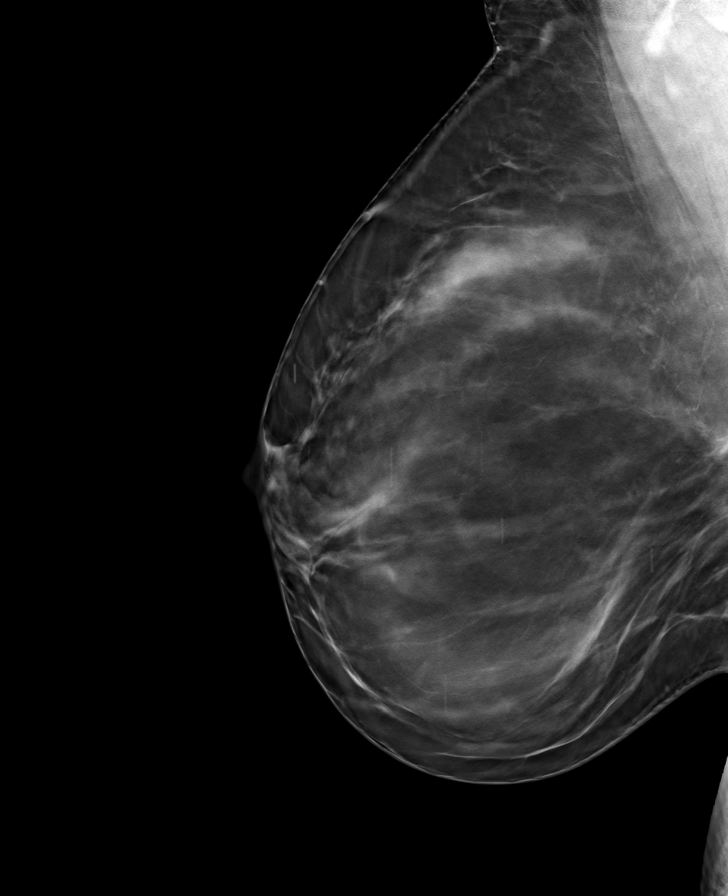

[L CC tomo · tomo slice 45/89.0]
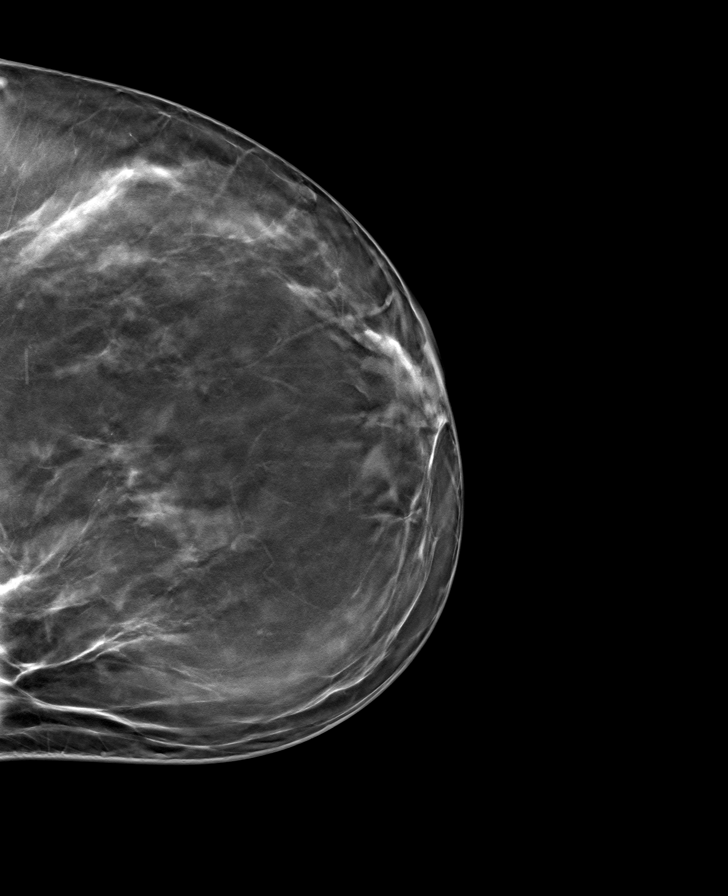

[L MLO tomo · tomo slice 49/96.0]
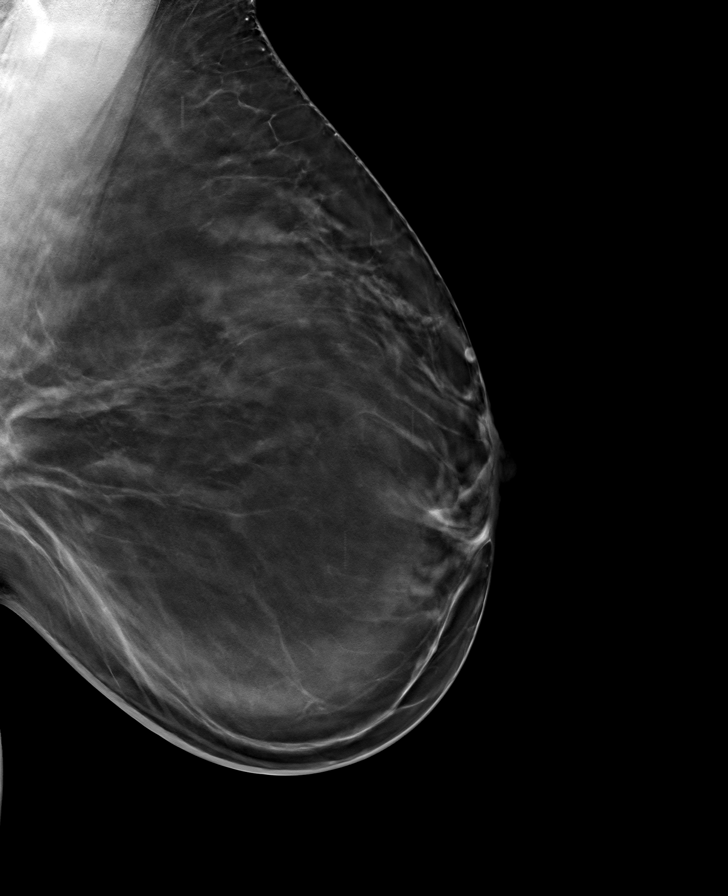

[8 of 24 positions shown; findings below may reference images not displayed]

ACR Breast Density Category b: There are scattered areas of
fibroglandular density.
FINDINGS: There are no findings suspicious for malignancy.
IMPRESSION: No mammographic evidence of malignancy. A result letter of this
screening mammogram will be mailed directly to the patient.

RECOMMENDATION:
Screening mammogram in one year. (Code:GB-Z-FIU)

BI-RADS CATEGORY  1: Negative.

## 2023-10-28 ENCOUNTER — Emergency Department
Admission: EM | Admit: 2023-10-28 | Discharge: 2023-10-28 | Disposition: A | Discharge status: Home / Self Care | Payer: Worker's Comp, Other unspecified | Attending: Emergency Medicine | Admitting: Emergency Medicine

## 2023-10-28 DIAGNOSIS — S0501XA Injury of conjunctiva and corneal abrasion without foreign body, right eye, initial encounter: Secondary | ICD-10-CM | POA: Insufficient documentation

## 2023-10-28 DIAGNOSIS — X58XXXA Exposure to other specified factors, initial encounter: Secondary | ICD-10-CM | POA: Insufficient documentation

## 2023-10-28 DIAGNOSIS — S0591XA Unspecified injury of right eye and orbit, initial encounter: Secondary | ICD-10-CM | POA: Diagnosis not present

## 2023-10-28 MED ORDER — LEVOFLOXACIN 0.5 % OP SOLN: 2.0000 [drp] | Freq: Four times a day (QID) | OPHTHALMIC | 0 refills | Status: DC

## 2023-10-28 MED ORDER — FLUORESCEIN SODIUM 1 MG OP STRP
1.0000 | ORAL_STRIP | Freq: Once | OPHTHALMIC | Status: AC
  Administered 2023-10-28: 1 via OPHTHALMIC
  Filled 2023-10-28: qty 1

## 2023-10-28 MED ORDER — TETRACAINE HCL 0.5 % OP SOLN
2.0000 [drp] | Freq: Once | OPHTHALMIC | Status: AC
  Administered 2023-10-28: 2 [drp] via OPHTHALMIC
  Filled 2023-10-28: qty 4

## 2023-10-28 NOTE — ED Provider Notes (Signed)
[facility] Provider Note    Event Date/Time   First MD Initiated Contact with Patient 10/28/23 1512     (approximate)   History   Foreign Body in Eye (Right /)   HPI  Heather Roach is a 52 y.o. female with PMH of HTN presents for evaluation of a possible foreign body in the right eye. 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  She feels like it is still there.  She endorses some discomfort and blurry vision.  She does wear cosmetic contacts.      Physical Exam   Triage Vital Signs: ED Triage Vitals [10/28/23 1451]  Encounter Vitals Group     BP (!) 162/104     Systolic BP Percentile      Diastolic BP Percentile      Pulse Rate 95     Resp 20     Temp 98 F (36.7 C)     Temp Source Oral     SpO2 95 %     Weight      Height      Head Circumference      Peak Flow      Pain Score 3     Pain Loc      Pain Education      Exclude from Growth Chart     Most recent vital signs: Vitals:   10/28/23 1451  BP: (!) 162/104  Pulse: 95  Resp: 20  Temp: 98 F (36.7 C)  SpO2: 95%   General: Awake, no distress.  CV:  Good peripheral perfusion.  Resp:  Normal effort.  Abd:  No distention.  Other:  PERRL, EOM intact, conjunctiva injected   ED Results / Procedures / Treatments   Labs (all labs ordered are listed, but only abnormal results are displayed) Labs Reviewed - No data to display    PROCEDURES:  Critical Care performed: No  Procedures   MEDICATIONS ORDERED IN ED: Medications  tetracaine (PONTOCAINE) 0.5 % ophthalmic solution 2 drop (2 drops Right Eye Given by Other 10/28/23 1456)  fluorescein ophthalmic strip 1 strip (1 strip Right Eye Given by Other 10/28/23 1455)     IMPRESSION / MDM / ASSESSMENT AND PLAN / ED COURSE  I reviewed the triage vital signs and the nursing notes.                             52 year old female  presents for evaluation of right eye foreign body.  Patient was hypertensive in triage but does have history of hypertension, vital signs stable otherwise.  Patient NAD on exam.  Differential diagnosis includes, but is not limited to, foreign body, corneal ulceration, corneal abrasion, corneal laceration, conjunctivitis.  Patient's presentation is most consistent with acute, uncomplicated illness.  Fluorescein dye exam reveals a corneal abrasion at about 12:00 below the pupil.  I did visualize both upper and lower eyelids and does not appear that there is a foreign body still in the eye.  Patient will be started on antibiotic eyedrops and was advised to follow-up with ophthalmology.  Nursing staff completed visual acuity screening and reported left eye was 20/30 and right eye was 20/100.  Patient given return precautions, she voiced understanding, all questions were answered and she was stable at discharge.     FINAL CLINICAL IMPRESSION(S) /  ED DIAGNOSES   Final diagnoses:  Abrasion of right cornea, initial encounter     Rx / DC Orders   ED Discharge Orders          Ordered    levofloxacin (QUIXIN) 0.5 % ophthalmic solution  Every 6 hours        10/28/23 1550             Note:  This document was prepared using Dragon voice recognition software and may include unintentional dictation errors.   Cameron Ali, PA-C 10/28/23 1550    Minna Antis, MD 10/28/23 2308

## 2023-10-28 NOTE — ED Notes (Signed)
See triage notes. Patient stated that she was blowing out a unit this morning when something blew into her right eye.

## 2023-10-28 NOTE — Discharge Instructions (Addendum)
I have sent antibiotic eyedrops to your pharmacy, please pick them up and begin using them today.  For the first 2 days placed 2 drops every 2 hours in your right eye.  After 2 days you can place 2 drops in your right eye every 6 hours for the next 5 days.  I have attached information for an ophthalmologist that I would like you to follow-up within 2 days.  Please call the office to schedule an appointment.  Turn to the ED with any new or worsening symptoms.

## 2023-10-29 ENCOUNTER — Telehealth: Payer: Self-pay | Admitting: Emergency Medicine

## 2023-10-29 MED ORDER — MOXIFLOXACIN HCL 0.5 % OP SOLN: OPHTHALMIC | 0 refills | Status: AC

## 2023-10-29 NOTE — Telephone Encounter (Signed)
Called by pharmacy stating that they are out of the levofloxacin drops that were prescribed for patient yesterday.  Reviewed chart with evidence of corneal abrasion which antibiotics were prescribed for.  They do have moxifloxacin.  New antibiotics were sent to her pharmacy to cover for her corneal abrasion.

## 2023-11-08 DIAGNOSIS — I10 Essential (primary) hypertension: Secondary | ICD-10-CM | POA: Diagnosis not present

## 2023-11-08 DIAGNOSIS — R519 Headache, unspecified: Secondary | ICD-10-CM | POA: Diagnosis not present

## 2023-11-08 DIAGNOSIS — E119 Type 2 diabetes mellitus without complications: Secondary | ICD-10-CM | POA: Diagnosis not present

## 2023-11-08 DIAGNOSIS — R112 Nausea with vomiting, unspecified: Secondary | ICD-10-CM | POA: Diagnosis not present

## 2023-11-13 ENCOUNTER — Telehealth: Payer: Self-pay | Admitting: *Deleted

## 2023-11-13 NOTE — Progress Notes (Signed)
Transition Care Management Unsuccessful Follow-up Telephone Call  Date of discharge and from where:  [facility] 10/29/2023  Attempts:  1st Attempt  Reason for unsuccessful TCM follow-up call:  Left voice message

## 2023-11-25 DIAGNOSIS — R590 Localized enlarged lymph nodes: Secondary | ICD-10-CM | POA: Diagnosis not present

## 2023-11-25 DIAGNOSIS — B9789 Other viral agents as the cause of diseases classified elsewhere: Secondary | ICD-10-CM | POA: Diagnosis not present

## 2023-11-25 DIAGNOSIS — J069 Acute upper respiratory infection, unspecified: Secondary | ICD-10-CM | POA: Diagnosis not present

## 2023-11-25 DIAGNOSIS — E119 Type 2 diabetes mellitus without complications: Secondary | ICD-10-CM | POA: Diagnosis not present

## 2023-11-25 DIAGNOSIS — Z79899 Other long term (current) drug therapy: Secondary | ICD-10-CM | POA: Diagnosis not present

## 2023-11-25 DIAGNOSIS — R059 Cough, unspecified: Secondary | ICD-10-CM | POA: Diagnosis not present

## 2023-11-25 DIAGNOSIS — U071 COVID-19: Secondary | ICD-10-CM | POA: Diagnosis not present

## 2023-11-25 DIAGNOSIS — R0981 Nasal congestion: Secondary | ICD-10-CM | POA: Diagnosis not present

## 2023-11-25 DIAGNOSIS — I10 Essential (primary) hypertension: Secondary | ICD-10-CM | POA: Diagnosis not present

## 2023-11-25 NOTE — ED Provider Notes (Signed)
 [facility] Emergency Department Provider Note    ED Clinical Impression    Final diagnoses:  COVID-19 (Primary)  Viral URI with cough        Impression, Medical Decision Making, Progress Notes and Critical Care    Impression, Differential Diagnosis and Plan of Care  This is an afebrile, nontoxic-appearing 53 year old female presenting for evaluation of cold cough symptoms since yesterday with COVID-positive testing and possible today.  Exam patient has mild bulging of bilateral tympanic membranes with serous effusion surrounding erythema, moderately injected posterior oropharynx no tonsil information or exudates, mild bilateral cervical lymphadenopathy, clear lung sounds in all fields, no notable rashes.  Discussed possible treatment with Paxlovid as patient would be eligible with her comorbidities.  Patient has not used this in the past and has not wish to take it at this time.  She was advised that if she decides she would like to take it she would need to do it within the next couple of days as it needs to be started within the first 5 days.  Patient given return precautions and discharged.  Patient provided with work note and return to work restrictions as well as masking recommendations.   Portions of this record have been created using Scientist, clinical (histocompatibility and immunogenetics). Dictation errors have been sought, but may not have been identified and corrected.  See chart and resident provider documentation for details.  ____________________________________________      History     Reason for Visit Flu Like Symptoms    HPI  Heather Roach is a 53 y.o. female medical history as listed below present for evaluation of cough, nasal congestion, nasal drainage since yesterday.  There is no associated nausea vomiting diarrhea.  She denies any fevers.   External Records Reviewed (Inpatient/Outpatient notes, Prior labs/imaging studies, Care Everywhere, PDMP, External ED  notes, etc)  None reviewed.  Past Medical History:  Diagnosis Date  . Diabetes mellitus (CMS-HCC)   . Hypertension     There is no problem list on file for this patient.   No past surgical history on file.  No current facility-administered medications for this encounter.  Current Outpatient Medications:  .  amlodipine  (NORVASC ) 10 MG tablet, Take 1 tablet (10 mg total) by mouth daily., Disp: 30 tablet, Rfl: 2 .  hydroCHLOROthiazide  12.5 MG tablet, Take 1 tablet (12.5 mg total) by mouth daily., Disp: 30 tablet, Rfl: 0  Allergies Patient has no known allergies.  No family history on file.  Social History Social History   Tobacco Use  . Smoking status: Never  . Smokeless tobacco: Never  Vaping Use  . Vaping status: Never Used  Substance Use Topics  . Alcohol use: Never  . Drug use: Never      Physical Exam   ED Triage Vitals [11/25/23 1416]  Enc Vitals Group     BP 132/88     Heart Rate 117     SpO2 Pulse      Resp 18     Temp 36.6 C (97.9 F)     Temp Source Oral     SpO2 99 %     Weight 75.8 kg (167 lb)     Height 1.651 m (5' 5)     Head Circumference      Peak Flow      Pain Score      Pain Loc      Pain Education      Exclude from Growth Chart  Constitutional: Alert and oriented. Well appearing and in no distress. Eyes: Conjunctivae are normal. Cardiovascular: Normal rate, regular rhythm.  Respiratory: Normal respiratory effort. Breath sounds are normal. Musculoskeletal: Normal range of motion in all extremities. Neurologic: Normal speech and language. No gross focal neurologic deficits are appreciated. Skin: Skin is warm, dry and intact. No rash noted. Psychiatric: Mood and affect are normal. Speech and behavior are normal.       Lennie Donnice [place], PA 11/25/23 1552

## 2023-11-30 ENCOUNTER — Emergency Department (HOSPITAL_COMMUNITY)
Admission: EM | Admit: 2023-11-30 | Discharge: 2023-11-30 | Disposition: A | Discharge status: Home / Self Care | Payer: 59 | Attending: Emergency Medicine | Admitting: Emergency Medicine

## 2023-11-30 ENCOUNTER — Other Ambulatory Visit: Payer: Self-pay

## 2023-11-30 ENCOUNTER — Encounter (HOSPITAL_COMMUNITY): Payer: Self-pay

## 2023-11-30 ENCOUNTER — Emergency Department (HOSPITAL_COMMUNITY): Payer: 59

## 2023-11-30 DIAGNOSIS — M7918 Myalgia, other site: Secondary | ICD-10-CM | POA: Diagnosis not present

## 2023-11-30 DIAGNOSIS — M546 Pain in thoracic spine: Secondary | ICD-10-CM | POA: Diagnosis not present

## 2023-11-30 DIAGNOSIS — Z8616 Personal history of COVID-19: Secondary | ICD-10-CM | POA: Diagnosis not present

## 2023-11-30 DIAGNOSIS — M791 Myalgia, unspecified site: Secondary | ICD-10-CM | POA: Diagnosis not present

## 2023-11-30 DIAGNOSIS — M549 Dorsalgia, unspecified: Secondary | ICD-10-CM | POA: Diagnosis not present

## 2023-11-30 LAB — URINALYSIS, ROUTINE W REFLEX MICROSCOPIC
Bilirubin Urine: NEGATIVE
Glucose, UA: NEGATIVE mg/dL
Hgb urine dipstick: NEGATIVE
Ketones, ur: NEGATIVE mg/dL
Leukocytes,Ua: NEGATIVE
Nitrite: NEGATIVE
Protein, ur: NEGATIVE mg/dL
Specific Gravity, Urine: 1.017 (ref 1.005–1.030)
pH: 5 (ref 5.0–8.0)

## 2023-11-30 LAB — COMPREHENSIVE METABOLIC PANEL
ALT: 22 U/L (ref 0–44)
AST: 21 U/L (ref 15–41)
Albumin: 4.2 g/dL (ref 3.5–5.0)
Alkaline Phosphatase: 46 U/L (ref 38–126)
Anion gap: 8 (ref 5–15)
BUN: 16 mg/dL (ref 6–20)
CO2: 26 mmol/L (ref 22–32)
Calcium: 9.4 mg/dL (ref 8.9–10.3)
Chloride: 97 mmol/L — ABNORMAL LOW (ref 98–111)
Creatinine, Ser: 0.99 mg/dL (ref 0.44–1.00)
GFR, Estimated: >60 mL/min (ref >60)
Glucose, Bld: 158 mg/dL — ABNORMAL HIGH (ref 70–99)
Potassium: 3.7 mmol/L (ref 3.5–5.1)
Sodium: 131 mmol/L — ABNORMAL LOW (ref 135–145)
Total Bilirubin: 0.4 mg/dL (ref 0.0–1.2)
Total Protein: 8.3 g/dL — ABNORMAL HIGH (ref 6.5–8.1)

## 2023-11-30 LAB — CBC
HCT: 40.4 % (ref 36.0–46.0)
Hemoglobin: 13.4 g/dL (ref 12.0–15.0)
MCH: 28.6 pg (ref 26.0–34.0)
MCHC: 33.2 g/dL (ref 30.0–36.0)
MCV: 86.3 fL (ref 80.0–100.0)
Platelets: 285 10*3/uL (ref 150–400)
RBC: 4.68 MIL/uL (ref 3.87–5.11)
RDW: 12.3 % (ref 11.5–15.5)
WBC: 9.7 10*3/uL (ref 4.0–10.5)
nRBC: 0 % (ref 0.0–0.2)

## 2023-11-30 LAB — CK: Total CK: 181 U/L (ref 38–234)

## 2023-11-30 LAB — POC URINE PREG, ED: Preg Test, Ur: NEGATIVE

## 2023-11-30 LAB — LIPASE, BLOOD: Lipase: 30 U/L (ref 11–51)

## 2023-11-30 MED ORDER — IBUPROFEN 600 MG PO TABS: 600.0000 mg | ORAL_TABLET | Freq: Four times a day (QID) | ORAL | 0 refills | Status: AC | PRN

## 2023-11-30 MED ORDER — IBUPROFEN 400 MG PO TABS
600.0000 mg | ORAL_TABLET | Freq: Once | ORAL | Status: AC
  Administered 2023-11-30: 600 mg via ORAL
  Filled 2023-11-30: qty 2

## 2023-11-30 MED ORDER — METHOCARBAMOL 750 MG PO TABS: 750.0000 mg | ORAL_TABLET | Freq: Four times a day (QID) | ORAL | 0 refills | Status: DC

## 2023-11-30 NOTE — ED Provider Triage Note (Signed)
 Emergency Medicine Provider Triage Evaluation Note  Heather Roach , a 53 y.o. female  was evaluated in triage.  Pt complains of UE pain.  Review of Systems  Positive: Bilat upper back pain, bil arm pain Negative: SOB, pleuritic pain, cough  Physical Exam  BP (!) 144/101   Pulse (!) 108   Temp 98.7 F (37.1 C) (Oral)   Resp 20   SpO2 100%  Gen:   Awake, no distress   Resp:  Normal effort  MSK:   Moves extremities without difficulty  Other:    Medical Decision Making  Medically screening exam initiated at 8:28 PM.  Appropriate orders placed.  Heather Roach was informed that the remainder of the evaluation will be completed by another provider, this initial triage assessment does not replace that evaluation, and the importance of remaining in the ED until their evaluation is complete.  C/O bilateral upper lateral thoracic back pain for the past several weeks. No SOB. Worse with movement. Now with same pain but includes both arms. No swelling, redness, fever  Had Covid one week ago.   Heather Balls, PA-C 11/30/23 2034

## 2023-11-30 NOTE — Discharge Instructions (Signed)
 Warm compresses to the back. Take the medications as prescribed. If pain persists, please see your doctor for recheck.

## 2023-11-30 NOTE — ED Provider Notes (Signed)
 [place] EMERGENCY DEPARTMENT AT [facility] Provider Note   CSN: [id number] Arrival date & time: 11/30/23  1823     History  Chief Complaint  Patient presents with   Body Pain    Heather Roach is a 53 y.o. female.  Patient to ED with persistent sharp pain with movement in bilateral upper back laterally. She has noticed it for the past several weeks. No SOB or pain with respirations. No fever. Recent diagnosis of COVID, however, symptoms started prior to diagnosis. She reports her URI symptoms have improved. She came to the ED tonight reporting the pain is now in both arms.   The history is provided by the patient. No language interpreter was used.       Home Medications Prior to Admission medications   Medication Sig Start Date End Date Taking? Authorizing Provider  ibuprofen  (ADVIL ) 600 MG tablet Take 1 tablet (600 mg total) by mouth every 6 (six) hours as needed. 11/30/23  Yes Kaleab Frasier, Margit, PA-C  methocarbamol  (ROBAXIN -750) 750 MG tablet Take 1 tablet (750 mg total) by mouth 4 (four) times daily. 11/30/23  Yes Odell Margit, PA-C  Accu-Chek Softclix Lancets lancets Use as instructed 02/26/22   Mayers, Cari S, PA-C  amLODipine  (NORVASC ) 5 MG tablet Take 1 tablet (5 mg total) by mouth daily. Patient not taking: Reported on 06/03/2023 10/28/22   Mallipeddi, Vishnu P, MD  Blood Glucose Monitoring Suppl (ACCU-CHEK GUIDE) w/Device KIT 1 Units by Does not apply route daily. 02/26/22   Mayers, Cari S, PA-C  chlorthalidone  (HYGROTON ) 25 MG tablet Take 1 tablet (25 mg total) by mouth daily. Patient not taking: Reported on 02/10/2023 10/28/22   Mallipeddi, Vishnu P, MD  diphenhydramine -acetaminophen  (TYLENOL  PM) 25-500 MG TABS tablet Take 1 tablet by mouth at bedtime as needed. Patient not taking: Reported on 07/01/2023    [provider]  glimepiride  (AMARYL ) 1 MG tablet Take 1 tablet (1 mg total) by mouth daily with breakfast. Patient not taking: Reported on 06/19/2023  06/03/23   Lorren Greig PARAS, NP  glucose blood (ACCU-CHEK GUIDE) test strip USE AS DIRECTED TWICE A DAY TO TEST BLOOD SUGARS 02/26/22   Mayers, Cari S, PA-C  ketoconazole  (NIZORAL ) 2 % cream Apply 1 Application topically 2 (two) times daily. Apply 0.5 gram to each foot 2x daily to affected areas Patient not taking: Reported on 07/01/2023 06/19/23   Standiford, Marsa FALCON, DPM  losartan  (COZAAR ) 50 MG tablet Take 1 tablet (50 mg total) by mouth daily. Patient not taking: Reported on 02/10/2023 10/28/22   Mallipeddi, Diannah SQUIBB, MD      Allergies    Patient has no known allergies.    Review of Systems   Review of Systems  Physical Exam Updated Vital Signs BP (!) 132/97   Pulse (!) 102   Temp 98.9 F (37.2 C) (Oral)   Resp 18   SpO2 99%  Physical Exam Constitutional:      Appearance: She is well-developed.  HENT:     Head: Normocephalic.  Cardiovascular:     Rate and Rhythm: Normal rate and regular rhythm.     Heart sounds: No murmur heard. Pulmonary:     Effort: Pulmonary effort is normal.     Breath sounds: Normal breath sounds. No wheezing, rhonchi or rales.  Abdominal:     General: Bowel sounds are normal.     Palpations: Abdomen is soft.     Tenderness: There is no abdominal tenderness. There is no guarding or rebound.  Musculoskeletal:        General: No swelling. Normal range of motion.     Cervical back: Normal range of motion and neck supple.     Comments: No redness or joint swelling. FROM UE's.    Skin:    General: Skin is warm and dry.  Neurological:     General: No focal deficit present.     Mental Status: She is alert and oriented to person, place, and time.     ED Results / Procedures / Treatments   Labs (all labs ordered are listed, but only abnormal results are displayed) Labs Reviewed  COMPREHENSIVE METABOLIC PANEL - Abnormal; Notable for the following components:      Result Value   Sodium 131 (*)    Chloride 97 (*)    Glucose, Bld 158 (*)    Total  Protein 8.3 (*)    All other components within normal limits  LIPASE, BLOOD  CBC  URINALYSIS, ROUTINE W REFLEX MICROSCOPIC  CK  POC URINE PREG, ED    EKG None  Radiology DG Chest 2 View Result Date: 11/30/2023 CLINICAL DATA:  Back pain radiating to the front and lateral side. Diagnosed with COVID 5 days ago. EXAM: CHEST - 2 VIEW COMPARISON:  Chest two views 01/12/2023 FINDINGS: Cardiac silhouette and mediastinal contours are within normal limits. The lungs are clear. No pleural effusion or pneumothorax. No acute skeletal abnormality. IMPRESSION: No active cardiopulmonary disease. Electronically Signed   By: Tanda Lyons M.D.   On: 11/30/2023 21:18    Procedures Procedures    Medications Ordered in ED Medications  ibuprofen  (ADVIL ) tablet 600 mg (600 mg Oral Given 11/30/23 2151)    ED Course/ Medical Decision Making/ A&P Clinical Course as of 11/30/23 2235  Mon Nov 30, 2023  2234 Patient with pain in the back and arms as detailed in HPI. CXR clear. Labs are reassuring. VSS. Pain is worse with movement, gone at rest, c/w MSK pain. Will treat supportively. Encouraged PCP follow up for recheck if no better in 3-4 days.  [SU]    Clinical Course User Index [SU] Odell Balls, PA-C                                 Medical Decision Making Amount and/or Complexity of Data Reviewed Labs: ordered. Radiology: ordered.  Risk Prescription drug management.           Final Clinical Impression(s) / ED Diagnoses Final diagnoses:  Musculoskeletal pain    Rx / DC Orders ED Discharge Orders          Ordered    ibuprofen  (ADVIL ) 600 MG tablet  Every 6 hours PRN        11/30/23 2210    methocarbamol  (ROBAXIN -750) 750 MG tablet  4 times daily        11/30/23 2210              Odell Balls, PA-C 11/30/23 2235    Yolande Lamar BROCKS, MD 12/02/23 [phone]

## 2023-11-30 NOTE — ED Notes (Signed)
 PA talked with pt in triage room and set to discharge pt. No questions from that pt at this time.

## 2023-11-30 NOTE — ED Triage Notes (Signed)
 Pt states that she has been having "shooting" body pain since being diagnosed with COVID back Wednesday. States that pain is shooting in her back right now on both sides and that sometimes it goes to her arms.

## 2023-12-17 NOTE — Patient Instructions (Incomplete)

## 2023-12-18 ENCOUNTER — Ambulatory Visit: Payer: 59 | Admitting: Nurse Practitioner

## 2023-12-18 DIAGNOSIS — Z7984 Long term (current) use of oral hypoglycemic drugs: Secondary | ICD-10-CM

## 2023-12-18 DIAGNOSIS — E119 Type 2 diabetes mellitus without complications: Secondary | ICD-10-CM

## 2023-12-18 DIAGNOSIS — I10 Essential (primary) hypertension: Secondary | ICD-10-CM

## 2023-12-28 ENCOUNTER — Encounter: Payer: Self-pay | Admitting: Family

## 2024-01-13 ENCOUNTER — Other Ambulatory Visit: Payer: Self-pay

## 2024-05-07 ENCOUNTER — Emergency Department (HOSPITAL_COMMUNITY)
Admission: EM | Admit: 2024-05-07 | Discharge: 2024-05-07 | Disposition: A | Discharge status: Home / Self Care | Payer: Self-pay | Attending: Emergency Medicine | Admitting: Emergency Medicine

## 2024-05-07 ENCOUNTER — Other Ambulatory Visit: Payer: Self-pay

## 2024-05-07 ENCOUNTER — Encounter (HOSPITAL_COMMUNITY): Payer: Self-pay

## 2024-05-07 ENCOUNTER — Emergency Department (HOSPITAL_COMMUNITY): Payer: Self-pay

## 2024-05-07 DIAGNOSIS — Z79899 Other long term (current) drug therapy: Secondary | ICD-10-CM | POA: Insufficient documentation

## 2024-05-07 DIAGNOSIS — S060XAD Concussion with loss of consciousness status unknown, subsequent encounter: Secondary | ICD-10-CM | POA: Insufficient documentation

## 2024-05-07 DIAGNOSIS — I10 Essential (primary) hypertension: Secondary | ICD-10-CM | POA: Insufficient documentation

## 2024-05-07 DIAGNOSIS — E119 Type 2 diabetes mellitus without complications: Secondary | ICD-10-CM | POA: Insufficient documentation

## 2024-05-07 DIAGNOSIS — W228XXD Striking against or struck by other objects, subsequent encounter: Secondary | ICD-10-CM | POA: Insufficient documentation

## 2024-05-07 MED ORDER — DIPHENHYDRAMINE HCL 50 MG/ML IJ SOLN
25.0000 mg | Freq: Once | INTRAMUSCULAR | Status: AC
  Administered 2024-05-07: 25 mg via INTRAVENOUS
  Filled 2024-05-07: qty 1

## 2024-05-07 MED ORDER — METOCLOPRAMIDE HCL 5 MG/ML IJ SOLN
10.0000 mg | Freq: Once | INTRAMUSCULAR | Status: AC
  Administered 2024-05-07: 10 mg via INTRAVENOUS
  Filled 2024-05-07: qty 2

## 2024-05-07 MED ORDER — AMLODIPINE BESYLATE 5 MG PO TABS
5.0000 mg | ORAL_TABLET | Freq: Once | ORAL | Status: AC
  Administered 2024-05-07: 5 mg via ORAL
  Filled 2024-05-07: qty 1

## 2024-05-07 MED ORDER — HYDROCHLOROTHIAZIDE 12.5 MG PO TABS: 12.5000 mg | ORAL_TABLET | Freq: Every day | ORAL | 0 refills | Status: DC

## 2024-05-07 MED ORDER — AMLODIPINE BESYLATE 5 MG PO TABS: 5.0000 mg | ORAL_TABLET | Freq: Every day | ORAL | 0 refills | Status: DC

## 2024-05-07 MED ORDER — HYDROCHLOROTHIAZIDE 12.5 MG PO TABS
12.5000 mg | ORAL_TABLET | Freq: Once | ORAL | Status: AC
  Administered 2024-05-07: 12.5 mg via ORAL
  Filled 2024-05-07: qty 1

## 2024-05-07 NOTE — Discharge Instructions (Signed)
 It was a pleasure taking care of you today.  You are seen for headache, your head CT scan was normal today, I think your symptoms are related to your concussion.  Please rest, avoid activities that require prolonged consultation, you can continue taking over-the-counter medication as needed for pain.  Come back to the ER for new or worsening symptoms.  I have represcribed your blood pressure medications at home for you to resume.
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
## 2024-06-02 ENCOUNTER — Encounter: Payer: 59 | Admitting: Family

## 2024-06-07 ENCOUNTER — Ambulatory Visit (INDEPENDENT_AMBULATORY_CARE_PROVIDER_SITE_OTHER): Payer: 59 | Admitting: Family

## 2024-06-07 ENCOUNTER — Encounter: Payer: Self-pay | Admitting: Family

## 2024-06-07 VITALS — BP 157/109 | HR 85 | Temp 98.0°F | Resp 16 | Ht 62.0 in | Wt 154.4 lb

## 2024-06-07 DIAGNOSIS — I10 Essential (primary) hypertension: Secondary | ICD-10-CM

## 2024-06-07 DIAGNOSIS — Z7984 Long term (current) use of oral hypoglycemic drugs: Secondary | ICD-10-CM

## 2024-06-07 DIAGNOSIS — Z1231 Encounter for screening mammogram for malignant neoplasm of breast: Secondary | ICD-10-CM

## 2024-06-07 DIAGNOSIS — Z0000 Encounter for general adult medical examination without abnormal findings: Secondary | ICD-10-CM

## 2024-06-07 DIAGNOSIS — Z130 Encounter for screening for diseases of the blood and blood-forming organs and certain disorders involving the immune mechanism: Secondary | ICD-10-CM

## 2024-06-07 DIAGNOSIS — Z13228 Encounter for screening for other metabolic disorders: Secondary | ICD-10-CM

## 2024-06-07 DIAGNOSIS — Z1329 Encounter for screening for other suspected endocrine disorder: Secondary | ICD-10-CM

## 2024-06-07 DIAGNOSIS — Z23 Encounter for immunization: Secondary | ICD-10-CM

## 2024-06-07 DIAGNOSIS — Z1211 Encounter for screening for malignant neoplasm of colon: Secondary | ICD-10-CM

## 2024-06-07 DIAGNOSIS — Z13220 Encounter for screening for lipoid disorders: Secondary | ICD-10-CM

## 2024-06-07 DIAGNOSIS — E1165 Type 2 diabetes mellitus with hyperglycemia: Secondary | ICD-10-CM

## 2024-06-07 MED ORDER — HYDROCHLOROTHIAZIDE 25 MG PO TABS: 25.0000 mg | ORAL_TABLET | Freq: Every day | ORAL | 0 refills | Status: DC

## 2024-06-07 NOTE — Progress Notes (Signed)
 Patient ID: Heather Roach, female    DOB: [birth year]  MRN: [id number]  CC: Annual Exam  Subjective: Heather Roach is a 53 y.o. female who presents for annual exam.   Her concerns today include:  - Up to date on cervical cancer screening per [facility]. - Doing well on Hydrochlorothiazide , no issues/concerns. She does not complain of red flag symptoms such as but not limited to chest pain, shortness of breath, worst headache of life, nausea/vomiting. States she plans to schedule appointment soon with established Cardiology.  - States she has not taken Metformin  in a while due to causing upset stomach. Denies red flag symptoms associated with diabetes.   Patient Active Problem List   Diagnosis Date Noted   ERRONEOUS ENCOUNTER--DISREGARD 03/02/2023   Numbness and tingling in left arm 10/28/2022   Essential hypertension 02/21/2022   Tachycardia 02/21/2022   Elevated random blood glucose level 02/21/2022   Hypocalcemia 02/21/2022     Current Outpatient Medications on File Prior to Visit  Medication Sig Dispense Refill   Accu-Chek Softclix Lancets lancets Use as instructed 100 each 12   amLODipine  (NORVASC ) 5 MG tablet Take 1 tablet (5 mg total) by mouth daily. 30 tablet 0   Blood Glucose Monitoring Suppl (ACCU-CHEK GUIDE) w/Device KIT 1 Units by Does not apply route daily. 1 kit 0   glucose blood (ACCU-CHEK GUIDE) test strip USE AS DIRECTED TWICE A DAY TO TEST BLOOD SUGARS 100 each 12   amLODipine  (NORVASC ) 5 MG tablet Take 1 tablet (5 mg total) by mouth daily. (Patient not taking: Reported on 06/03/2023) 30 tablet 2   chlorthalidone  (HYGROTON ) 25 MG tablet Take 1 tablet (25 mg total) by mouth daily. (Patient not taking: Reported on 02/10/2023) 30 tablet 2   diphenhydramine -acetaminophen  (TYLENOL  PM) 25-500 MG TABS tablet Take 1 tablet by mouth at bedtime as needed. (Patient not taking: Reported on 07/01/2023)     glimepiride  (AMARYL ) 1 MG tablet Take 1 tablet (1 mg total) by mouth daily  with breakfast. (Patient not taking: Reported on 06/19/2023) 30 tablet 1   ibuprofen  (ADVIL ) 600 MG tablet Take 1 tablet (600 mg total) by mouth every 6 (six) hours as needed. (Patient not taking: Reported on 06/07/2024) 30 tablet 0   ketoconazole  (NIZORAL ) 2 % cream Apply 1 Application topically 2 (two) times daily. Apply 0.5 gram to each foot 2x daily to affected areas (Patient not taking: Reported on 07/01/2023) 30 g 0   losartan  (COZAAR ) 50 MG tablet Take 1 tablet (50 mg total) by mouth daily. (Patient not taking: Reported on 02/10/2023) 30 tablet 2   methocarbamol  (ROBAXIN -750) 750 MG tablet Take 1 tablet (750 mg total) by mouth 4 (four) times daily. (Patient not taking: Reported on 06/07/2024) 28 tablet 0   No current facility-administered medications on file prior to visit.    No Known Allergies  Social History   Socioeconomic History   Marital status: Married    Spouse name: Not on file   Number of children: Not on file   Years of education: Not on file   Highest education level: Not on file  Occupational History   Not on file  Tobacco Use   Smoking status: Never    Passive exposure: Never   Smokeless tobacco: Never  Vaping Use   Vaping status: Never Used  Substance and Sexual Activity   Alcohol use: Not Currently   Drug use: Not Currently   Sexual activity: Yes  Other Topics Concern   Not on  file  Social History Narrative   Not on file   Social Drivers of Health   Financial Resource Strain: Low Risk  (06/07/2024)   Overall Financial Resource Strain (CARDIA)    Difficulty of Paying Living Expenses: Not hard at all  Food Insecurity: No Food Insecurity (06/07/2024)   Hunger Vital Sign    Worried About Running Out of Food in the Last Year: Never true    Ran Out of Food in the Last Year: Never true  Transportation Needs: No Transportation Needs (06/07/2024)   PRAPARE - Administrator, Civil Service (Medical): No    Lack of Transportation (Non-Medical): No   Physical Activity: Sufficiently Active (06/07/2024)   Exercise Vital Sign    Days of Exercise per Week: 7 days    Minutes of Exercise per Session: 30 min  Stress: No Stress Concern Present (06/07/2024)   [employer] of Occupational Health - Occupational Stress Questionnaire    Feeling of Stress: Not at all  Social Connections: Moderately Integrated (06/07/2024)   Social Connection and Isolation Panel    Frequency of Communication with Friends and Family: More than three times a week    Frequency of Social Gatherings with Friends and Family: More than three times a week    Attends Religious Services: More than 4 times per year    Active Member of [employer] or Organizations: No    Attends Banker Meetings: Never    Marital Status: Married  Catering manager Violence: Not At Risk (06/07/2024)   Humiliation, Afraid, Rape, and Kick questionnaire    Fear of Current or Ex-Partner: No    Emotionally Abused: No    Physically Abused: No    Sexually Abused: No    Family History  Problem Relation Age of Onset   Diabetes Mother    Heart failure Mother    Hypertension Mother    Hypertension Sister    Cancer Sister    Sarcoidosis Sister    Diabetes Brother     Past Surgical History:  Procedure Laterality Date   NO PAST SURGERIES      ROS: Review of Systems Negative except as stated above  PHYSICAL EXAM: BP (!) 157/109   Pulse 85   Temp 98 F (36.7 C) (Oral)   Resp 16   Ht 5' 2 (1.575 m)   Wt 154 lb 6.4 oz (70 kg)   LMP 06/02/2024 (Approximate)   SpO2 97%   BMI 28.24 kg/m      06/07/2024    9:29 AM 06/07/2024    9:14 AM 05/07/2024    7:32 PM  Vitals with BMI  Height  5' 2   Weight  154 lbs 6 oz   BMI  28.23   Systolic 157 149 859  Diastolic 109 106 86  Pulse  85 78    Physical Exam HENT:     Head: Normocephalic and atraumatic.     Right Ear: Tympanic membrane, ear canal and external ear normal.     Left Ear: Tympanic membrane, ear canal and external  ear normal.     Nose: Nose normal.     Mouth/Throat:     Mouth: Mucous membranes are moist.     Pharynx: Oropharynx is clear.  Eyes:     Extraocular Movements: Extraocular movements intact.     Conjunctiva/sclera: Conjunctivae normal.     Pupils: Pupils are equal, round, and reactive to light.  Neck:     Thyroid : No thyroid  mass, thyromegaly  or thyroid  tenderness.  Cardiovascular:     Rate and Rhythm: Normal rate and regular rhythm.     Pulses: Normal pulses.     Heart sounds: Normal heart sounds.  Pulmonary:     Effort: Pulmonary effort is normal.     Breath sounds: Normal breath sounds.  Chest:     Comments: Patient declined. Abdominal:     General: Bowel sounds are normal.     Palpations: Abdomen is soft.  Genitourinary:    Comments: Patient declined. Musculoskeletal:        General: Normal range of motion.     Right shoulder: Normal.     Left shoulder: Normal.     Right upper arm: Normal.     Left upper arm: Normal.     Right elbow: Normal.     Left elbow: Normal.     Right forearm: Normal.     Left forearm: Normal.     Right wrist: Normal.     Left wrist: Normal.     Right hand: Normal.     Left hand: Normal.     Cervical back: Normal, normal range of motion and neck supple.     Thoracic back: Normal.     Lumbar back: Normal.     Right hip: Normal.     Left hip: Normal.     Right upper leg: Normal.     Left upper leg: Normal.     Right knee: Normal.     Left knee: Normal.     Right lower leg: Normal.     Left lower leg: Normal.     Right ankle: Normal.     Left ankle: Normal.     Right foot: Normal.     Left foot: Normal.  Skin:    General: Skin is warm and dry.     Capillary Refill: Capillary refill takes less than 2 seconds.  Neurological:     General: No focal deficit present.     Mental Status: She is alert and oriented to person, place, and time.  Psychiatric:        Mood and Affect: Mood normal.        Behavior: Behavior normal.      ASSESSMENT AND PLAN: 1. Annual physical exam (Primary) - Counseled on 150 minutes of exercise per week as tolerated, healthy eating (including decreased daily intake of saturated fats, cholesterol, added sugars, sodium), STI prevention, and routine healthcare maintenance.  2. Screening for metabolic disorder - Routine screening.  - CMP14+EGFR  3. Screening for deficiency anemia - Routine screening.  - CBC  4. Screening cholesterol level - Routine screening.  - Lipid panel  5. Thyroid  disorder screen - Routine screening.  - TSH  6. Encounter for screening mammogram for malignant neoplasm of breast - Routine screening.  - MM Digital Screening; Future  7. Colon cancer screening - Referral to Gastroenterology for colon cancer screening by colonoscopy.  - Ambulatory referral to Gastroenterology  8. Primary hypertension - Blood pressure not at goal during today's visit. Patient asymptomatic without chest pressure, chest pain, palpitations, shortness of breath, worst headache of life, and any additional red flag symptoms. - Increase Hydrochlorothiazide  from 12.5 mg to 25 mg as prescribed.  - Counseled on blood pressure goal of less than 130/80, low-sodium, DASH diet, medication compliance, and 150 minutes of moderate intensity exercise per week as tolerated. Counseled on medication adherence and adverse effects. - Keep all scheduled appointments with established Cardiology.  - Follow-up with  primary provider as scheduled.  - hydrochlorothiazide  (HYDRODIURIL ) 25 MG tablet; Take 1 tablet (25 mg total) by mouth daily.  Dispense: 90 tablet; Refill: 0  9. Type 2 diabetes mellitus with hyperglycemia, without long-term current use of insulin (HCC) - Patient intolerant to Metformin .  - Hemoglobin A1c result pending. Will update pharmacological plan once lab results.  - Routine screening.  - Discussed the importance of healthy eating habits, low-carbohydrate diet, low-sugar diet,  regular aerobic exercise (at least 150 minutes a week as tolerated) and medication compliance to achieve or maintain control of diabetes. Counseled on medication adherence/adverse effects.  - Follow-up with primary provider as scheduled.  - Hemoglobin A1c - Microalbumin / creatinine urine ratio  10. Immunization due - Administered.  - Tdap vaccine greater than or equal to 7yo IM - Heplisav-B  (HepB-CPG) Vaccine    Patient was given the opportunity to ask questions.  Patient verbalized understanding of the plan and was able to repeat key elements of the plan. Patient was given clear instructions to go to Emergency Department or return to medical center if symptoms don't improve, worsen, or new problems develop.The patient verbalized understanding.   Orders Placed This Encounter  Procedures   MM Digital Screening   Tdap vaccine greater than or equal to 7yo IM   Heplisav-B  (HepB-CPG) Vaccine   CBC   Lipid panel   CMP14+EGFR   Hemoglobin A1c   TSH   Microalbumin / creatinine urine ratio   Ambulatory referral to Gastroenterology     Requested Prescriptions   Signed Prescriptions Disp Refills   hydrochlorothiazide  (HYDRODIURIL ) 25 MG tablet 90 tablet 0    Sig: Take 1 tablet (25 mg total) by mouth daily.    Return in about 1 year (around 06/07/2025) for Physical per patient preference.  Greig JINNY Drones, NP

## 2024-06-08 ENCOUNTER — Ambulatory Visit: Payer: Self-pay | Admitting: Family

## 2024-06-08 DIAGNOSIS — E785 Hyperlipidemia, unspecified: Secondary | ICD-10-CM

## 2024-06-08 DIAGNOSIS — E1165 Type 2 diabetes mellitus with hyperglycemia: Secondary | ICD-10-CM

## 2024-06-08 LAB — CMP14+EGFR
ALT: 22 IU/L (ref 0–32)
AST: 18 IU/L (ref 0–40)
Albumin: 4.5 g/dL (ref 3.8–4.9)
Alkaline Phosphatase: 65 IU/L (ref 44–121)
BUN/Creatinine Ratio: 15 (ref 9–23)
BUN: 13 mg/dL (ref 6–24)
Bilirubin Total: 0.3 mg/dL (ref 0.0–1.2)
CO2: 21 mmol/L (ref 20–29)
Calcium: 9.4 mg/dL (ref 8.7–10.2)
Chloride: 99 mmol/L (ref 96–106)
Creatinine, Ser: 0.87 mg/dL (ref 0.57–1.00)
Globulin, Total: 3 g/dL (ref 1.5–4.5)
Glucose: 199 mg/dL — ABNORMAL HIGH (ref 70–99)
Potassium: 4.2 mmol/L (ref 3.5–5.2)
Sodium: 135 mmol/L (ref 134–144)
Total Protein: 7.5 g/dL (ref 6.0–8.5)
eGFR: 80 mL/min/1.73 (ref >59)

## 2024-06-08 LAB — LIPID PANEL
Chol/HDL Ratio: 4.8 ratio — ABNORMAL HIGH (ref 0.0–4.4)
Cholesterol, Total: 169 mg/dL (ref 100–199)
HDL: 35 mg/dL — ABNORMAL LOW (ref >39)
LDL Chol Calc (NIH): 111 mg/dL — ABNORMAL HIGH (ref 0–99)
Triglycerides: 129 mg/dL (ref 0–149)
VLDL Cholesterol Cal: 23 mg/dL (ref 5–40)

## 2024-06-08 LAB — CBC
Hematocrit: 40.2 % (ref 34.0–46.6)
Hemoglobin: 12.9 g/dL (ref 11.1–15.9)
MCH: 29.2 pg (ref 26.6–33.0)
MCHC: 32.1 g/dL (ref 31.5–35.7)
MCV: 91 fL (ref 79–97)
Platelets: 297 x10E3/uL (ref 150–450)
RBC: 4.42 x10E6/uL (ref 3.77–5.28)
RDW: 13.2 % (ref 11.7–15.4)
WBC: 6.2 x10E3/uL (ref 3.4–10.8)

## 2024-06-08 LAB — MICROALBUMIN / CREATININE URINE RATIO
Creatinine, Urine: 72.2 mg/dL
Microalb/Creat Ratio: 18 mg/g{creat} (ref 0–29)
Microalbumin, Urine: 13 ug/mL

## 2024-06-08 LAB — HEMOGLOBIN A1C
Est. average glucose Bld gHb Est-mCnc: 186 mg/dL
Hgb A1c MFr Bld: 8.1 % — ABNORMAL HIGH (ref 4.8–5.6)

## 2024-06-08 LAB — TSH: TSH: 3.01 u[IU]/mL (ref 0.450–4.500)

## 2024-06-08 MED ORDER — ATORVASTATIN CALCIUM 20 MG PO TABS: 20.0000 mg | ORAL_TABLET | Freq: Every day | ORAL | 0 refills | Status: DC

## 2024-06-08 MED ORDER — EMPAGLIFLOZIN 10 MG PO TABS: 10.0000 mg | ORAL_TABLET | Freq: Every day | ORAL | 0 refills | Status: DC

## 2024-06-14 ENCOUNTER — Encounter (INDEPENDENT_AMBULATORY_CARE_PROVIDER_SITE_OTHER): Payer: Self-pay | Admitting: *Deleted

## 2024-09-06 ENCOUNTER — Ambulatory Visit: Payer: Self-pay | Admitting: Family

## 2024-09-07 ENCOUNTER — Ambulatory Visit (INDEPENDENT_AMBULATORY_CARE_PROVIDER_SITE_OTHER): Payer: Self-pay | Admitting: Family

## 2024-09-07 ENCOUNTER — Encounter: Payer: Self-pay | Admitting: Family

## 2024-09-07 VITALS — BP 150/111 | HR 85 | Temp 98.4°F | Resp 16 | Ht 62.0 in | Wt 167.8 lb

## 2024-09-07 DIAGNOSIS — I10 Essential (primary) hypertension: Secondary | ICD-10-CM

## 2024-09-07 DIAGNOSIS — E785 Hyperlipidemia, unspecified: Secondary | ICD-10-CM

## 2024-09-07 DIAGNOSIS — E1165 Type 2 diabetes mellitus with hyperglycemia: Secondary | ICD-10-CM

## 2024-09-07 DIAGNOSIS — Z7985 Long-term (current) use of injectable non-insulin antidiabetic drugs: Secondary | ICD-10-CM

## 2024-09-07 MED ORDER — SEMAGLUTIDE(0.25 OR 0.5MG/DOS) 2 MG/1.5ML ~~LOC~~ SOPN: 0.2500 mg | PEN_INJECTOR | SUBCUTANEOUS | 0 refills | Status: DC

## 2024-09-07 MED ORDER — HYDROCHLOROTHIAZIDE 50 MG PO TABS: 50.0000 mg | ORAL_TABLET | Freq: Every day | ORAL | 0 refills | Status: AC

## 2024-09-07 MED ORDER — ATORVASTATIN CALCIUM 20 MG PO TABS: 20.0000 mg | ORAL_TABLET | Freq: Every day | ORAL | 0 refills | Status: DC

## 2024-09-07 NOTE — Progress Notes (Signed)
 Patient ID: Heather Roach, female    DOB: 1971-06-16  MRN: [id number]  CC: Chronic Conditions Follow-Up  Subjective: Heather Roach is a 53 y.o. female who presents for chronic conditions follow-up.   Her concerns today include:  - Doing well on Hydrochlorothiazide , no issues/concerns. States she never followed-up with Cardiology because they did not call her to schedule an appointment. She does not complain of red flag symptoms such as but not limited to chest pain, shortness of breath, worst headache of life, nausea/vomiting.  - States she did not take Empagliflozin  because it cost $1900. States she would like to try a diabetic medication injection. States home blood sugars are up and down. Denies red flag symptoms associated with diabetes.  - Doing well on Atorvastatin , no issues/concerns.   Patient Active Problem List   Diagnosis Date Noted   ERRONEOUS ENCOUNTER--DISREGARD 03/02/2023   Numbness and tingling in left arm 10/28/2022   Essential hypertension 02/21/2022   Tachycardia 02/21/2022   Elevated random blood glucose level 02/21/2022   Hypocalcemia 02/21/2022     Current Outpatient Medications on File Prior to Visit  Medication Sig Dispense Refill   Accu-Chek Softclix Lancets lancets Use as instructed 100 each 12   amLODipine  (NORVASC ) 5 MG tablet Take 1 tablet (5 mg total) by mouth daily. 30 tablet 0   Blood Glucose Monitoring Suppl (ACCU-CHEK GUIDE) w/Device KIT 1 Units by Does not apply route daily. 1 kit 0   amLODipine  (NORVASC ) 5 MG tablet Take 1 tablet (5 mg total) by mouth daily. (Patient not taking: Reported on 06/03/2023) 30 tablet 2   chlorthalidone  (HYGROTON ) 25 MG tablet Take 1 tablet (25 mg total) by mouth daily. (Patient not taking: Reported on 02/10/2023) 30 tablet 2   diphenhydramine -acetaminophen  (TYLENOL  PM) 25-500 MG TABS tablet Take 1 tablet by mouth at bedtime as needed. (Patient not taking: Reported on 07/01/2023)     empagliflozin  (JARDIANCE ) 10 MG TABS  tablet Take 1 tablet (10 mg total) by mouth daily. (Patient not taking: Reported on 09/07/2024) 90 tablet 0   glimepiride  (AMARYL ) 1 MG tablet Take 1 tablet (1 mg total) by mouth daily with breakfast. (Patient not taking: Reported on 06/19/2023) 30 tablet 1   glucose blood (ACCU-CHEK GUIDE) test strip USE AS DIRECTED TWICE A DAY TO TEST BLOOD SUGARS 100 each 12   ibuprofen  (ADVIL ) 600 MG tablet Take 1 tablet (600 mg total) by mouth every 6 (six) hours as needed. (Patient not taking: Reported on 06/07/2024) 30 tablet 0   ketoconazole  (NIZORAL ) 2 % cream Apply 1 Application topically 2 (two) times daily. Apply 0.5 gram to each foot 2x daily to affected areas (Patient not taking: Reported on 07/01/2023) 30 g 0   losartan  (COZAAR ) 50 MG tablet Take 1 tablet (50 mg total) by mouth daily. (Patient not taking: Reported on 02/10/2023) 30 tablet 2   methocarbamol  (ROBAXIN -750) 750 MG tablet Take 1 tablet (750 mg total) by mouth 4 (four) times daily. (Patient not taking: Reported on 06/07/2024) 28 tablet 0   No current facility-administered medications on file prior to visit.    No Known Allergies  Social History   Socioeconomic History   Marital status: Married    Spouse name: Not on file   Number of children: Not on file   Years of education: Not on file   Highest education level: Not on file  Occupational History   Not on file  Tobacco Use   Smoking status: Never    Passive exposure:  Never   Smokeless tobacco: Never  Vaping Use   Vaping status: Never Used  Substance and Sexual Activity   Alcohol use: Not Currently   Drug use: Not Currently   Sexual activity: Yes  Other Topics Concern   Not on file  Social History Narrative   Not on file   Social Drivers of Health   Financial Resource Strain: Low Risk  (06/07/2024)   Overall Financial Resource Strain (CARDIA)    Difficulty of Paying Living Expenses: Not hard at all  Food Insecurity: No Food Insecurity (06/07/2024)   Hunger Vital Sign     Worried About Running Out of Food in the Last Year: Never true    Ran Out of Food in the Last Year: Never true  Transportation Needs: No Transportation Needs (06/07/2024)   PRAPARE - Administrator, Civil Service (Medical): No    Lack of Transportation (Non-Medical): No  Physical Activity: Sufficiently Active (06/07/2024)   Exercise Vital Sign    Days of Exercise per Week: 7 days    Minutes of Exercise per Session: 30 min  Stress: No Stress Concern Present (06/07/2024)   [employer] of Occupational Health - Occupational Stress Questionnaire    Feeling of Stress: Not at all  Social Connections: Moderately Integrated (06/07/2024)   Social Connection and Isolation Panel    Frequency of Communication with Friends and Family: More than three times a week    Frequency of Social Gatherings with Friends and Family: More than three times a week    Attends Religious Services: More than 4 times per year    Active Member of [employer] or Organizations: No    Attends Banker Meetings: Never    Marital Status: Married  Catering manager Violence: Not At Risk (06/07/2024)   Humiliation, Afraid, Rape, and Kick questionnaire    Fear of Current or Ex-Partner: No    Emotionally Abused: No    Physically Abused: No    Sexually Abused: No    Family History  Problem Relation Age of Onset   Diabetes Mother    Heart failure Mother    Hypertension Mother    Hypertension Sister    Cancer Sister    Sarcoidosis Sister    Diabetes Brother     Past Surgical History:  Procedure Laterality Date   NO PAST SURGERIES      ROS: Review of Systems Negative except as stated above  PHYSICAL EXAM: BP (!) 150/111   Pulse 85   Temp 98.4 F (36.9 C) (Oral)   Resp 16   Ht 5' 2 (1.575 m)   Wt 167 lb 12.8 oz (76.1 kg)   LMP 08/22/2024 (Approximate)   SpO2 96%   BMI 30.69 kg/m   Physical Exam HENT:     Head: Normocephalic and atraumatic.     Nose: Nose normal.     Mouth/Throat:      Mouth: Mucous membranes are moist.     Pharynx: Oropharynx is clear.  Eyes:     Extraocular Movements: Extraocular movements intact.     Conjunctiva/sclera: Conjunctivae normal.     Pupils: Pupils are equal, round, and reactive to light.  Cardiovascular:     Rate and Rhythm: Normal rate and regular rhythm.     Pulses: Normal pulses.     Heart sounds: Normal heart sounds.  Pulmonary:     Effort: Pulmonary effort is normal.     Breath sounds: Normal breath sounds.  Musculoskeletal:  General: Normal range of motion.     Cervical back: Normal range of motion and neck supple.  Neurological:     General: No focal deficit present.     Mental Status: She is alert and oriented to person, place, and time.  Psychiatric:        Mood and Affect: Mood normal.        Behavior: Behavior normal.     ASSESSMENT AND PLAN: 1. Primary hypertension (Primary) - Blood pressure not at goal during today's visit. Patient asymptomatic without chest pressure, chest pain, palpitations, shortness of breath, worst headache of life, and any additional red flag symptoms. - Increase Hydrochlorothiazide  from 25 mg to 50 mg as prescribed.  - Counseled on blood pressure goal of less than 130/80, low-sodium, DASH diet, medication compliance, and 150 minutes of moderate intensity exercise per week as tolerated. Counseled on medication adherence and adverse effects. - Referral to Cardiology for evaluation/management.  - Follow-up with primary provider as scheduled. - hydrochlorothiazide  (HYDRODIURIL ) 50 MG tablet; Take 1 tablet (50 mg total) by mouth daily.  Dispense: 90 tablet; Refill: 0 - Ambulatory referral to Cardiology  2. Type 2 diabetes mellitus with hyperglycemia, without long-term current use of insulin (HCC) - Hemoglobin A1c result pending.  - Semaglutide as prescribed. - Discussed the importance of healthy eating habits, low-carbohydrate diet, low-sugar diet, regular aerobic exercise (at least 150  minutes a week as tolerated) and medication compliance to achieve or maintain control of diabetes. Counseled on medication adherence/adverse effects.  - Follow-up with primary provider in 4 weeks or sooner if needed.  - Semaglutide,0.25 or 0.5MG /DOS, 2 MG/1.5ML SOPN; Inject 0.25 mg into the skin once a week.  Dispense: 6 mL; Refill: 0 - Hemoglobin A1c  3. Hyperlipidemia, unspecified hyperlipidemia type - Continue Atorvastatin  as prescribed. Counseled on medication adherence/adverse effects.  - Routine screening.  - Follow-up with primary provider as scheduled.  - atorvastatin  (LIPITOR) 20 MG tablet; Take 1 tablet (20 mg total) by mouth daily.  Dispense: 90 tablet; Refill: 0 - Lipid panel   Patient was given the opportunity to ask questions.  Patient verbalized understanding of the plan and was able to repeat key elements of the plan. Patient was given clear instructions to go to Emergency Department or return to medical center if symptoms don't improve, worsen, or new problems develop.The patient verbalized understanding.   Orders Placed This Encounter  Procedures   Hemoglobin A1c   Lipid panel   Ambulatory referral to Cardiology     Requested Prescriptions   Signed Prescriptions Disp Refills   atorvastatin  (LIPITOR) 20 MG tablet 90 tablet 0    Sig: Take 1 tablet (20 mg total) by mouth daily.   hydrochlorothiazide  (HYDRODIURIL ) 50 MG tablet 90 tablet 0    Sig: Take 1 tablet (50 mg total) by mouth daily.   Semaglutide,0.25 or 0.5MG /DOS, 2 MG/1.5ML SOPN 6 mL 0    Sig: Inject 0.25 mg into the skin once a week.    Return in about 4 weeks (around 10/05/2024) for Follow-Up or next available chronic conditions.  Greig JINNY Drones, NP

## 2024-09-08 LAB — LIPID PANEL
Chol/HDL Ratio: 5 ratio — ABNORMAL HIGH (ref 0.0–4.4)
Cholesterol, Total: 165 mg/dL (ref 100–199)
HDL: 33 mg/dL — ABNORMAL LOW
LDL Chol Calc (NIH): 96 mg/dL (ref 0–99)
Triglycerides: 211 mg/dL — ABNORMAL HIGH (ref 0–149)
VLDL Cholesterol Cal: 36 mg/dL (ref 5–40)

## 2024-09-08 LAB — HEMOGLOBIN A1C
Est. average glucose Bld gHb Est-mCnc: 212 mg/dL
Hgb A1c MFr Bld: 9 % — ABNORMAL HIGH (ref 4.8–5.6)

## 2024-09-09 ENCOUNTER — Ambulatory Visit: Payer: Self-pay | Admitting: Family

## 2024-09-09 DIAGNOSIS — E785 Hyperlipidemia, unspecified: Secondary | ICD-10-CM

## 2024-09-09 MED ORDER — ATORVASTATIN CALCIUM 40 MG PO TABS: 40.0000 mg | ORAL_TABLET | Freq: Every day | ORAL | 0 refills | Status: AC

## 2024-10-03 NOTE — Progress Notes (Unsigned)
 Cardiology Office Note:  .   Date:  10/04/2024  ID:  Heather Roach, DOB Apr 25, 1971, MRN [id number] PCP: Heather Greig PARAS, NP  [facility] Providers Cardiologist:  Heather SHAUNNA Maywood, MD {  History of Present Illness: .   Heather Roach is a 53 y.o. female  with PMHx of HTN, DM2, HLD, and COVID positive in 11/2023 who reports to [facility] office for HTN follow up.   Last seen in heartcare 10/2022 with Dr. Mallipeddi for referral related to HTN med management.  Reported isolated left arm numbness/tingling feeling, otherwise denied any cardiac complaints.  BP elevated in office 186/120 with home SBP reportedly 170.  Discontinued all HTN meds including HCTZ 50 mg daily, valsartan  80 mg daily and Toprol  XL 25 mg daily.  Started amlodipine  5 mg daily, chlorthalidone  25 mg daily and losartan  50 mg daily.   Follow-up echo 10/2022 showed EF 60 to 65%, G1DD, normal LV/RV function, and no significant valvular abnormalities.  Multiple ED visits since last OV.  Seen 01/12/2023 for chest pain and hypertension with reassuring workup and BP improvement without intervention then patient discharged same day.  Seen 11/08/2023 at [facility] health for N/V, headache and elevated BP of 195/102.  Noted patient noncompliant with HTN meds and not taking amlodipine  since it makes her feel poorly.  Treated with IV labetalol.  Discharged amlodipine  10 mg daily and HCTZ 12.5 mg daily.  Seen 11/2023 for flulike symptoms related to COVID-positive testing.  Patient denied Paxlovid as treatment.  Seen 05/01/2024 transferred from [facility] ED to [facility] with suspected SAH versus artifact on CT head.  Trauma team workup was unremarkable and  patient was discharged on 6/16.  Seen 05/07/2024 for headache and nausea in the setting of being out of BP medications X 2 weeks.  Treated with Reglan , Benadryl , amlodipine  5 mg and HCTZ 12.5 mg.   Seen by family medicine 09/07/2024.  BP elevated 150/111.  Increase hydrochlorothiazide  from 25  mg to 50 mg daily.  Continued on amlodipine  5 mg, Lipitor 20 mg.   Today, denies chest pain, shortness of breath, palpitations, syncope, presyncope, dizziness, orthopnea, PND, swelling or significant weight changes, acute bleeding, or claudication.  Reports compliance with medications but only taking hydrochlorothiazide  50 mg, Lipitor 40 mg and Ozempic.  Notes that she has not taken amlodipine  due to feeling poor and is not interested in restarting. She has been feeling significantly better since 09/07/2024 and focusing on lifestyle modifications.  Home BP has been 130s/80-90s he has changed her diet significantly including cutting out fried foods and sodas.  She is working on lowering daily sodium intake.  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  She does home workouts 2-3 times per week without any concerns. Denies tobacco use/alcohol/drug use.   ROS: 10 point review of system has been reviewed and considered negative except ones been listed in the HPI.   Studies Reviewed: SABRA   EKG Interpretation Date/Time:  Tuesday October 04 2024 14:22:38 EST Ventricular Rate:  84 PR Interval:  144 QRS Duration:  88 QT Interval:  364 QTC Calculation: 430 R Axis:   33  Text Interpretation: Normal sinus rhythm Nonspecific T wave abnormality When compared with ECG of 12-Jan-2023 19:59, No significant change was found Confirmed by Sheron Hallmark ([id number]) on 10/04/2024 2:27:24 PM   ECHO IMPRESSIONS 10/2022  1. Left ventricular ejection fraction, by estimation, is 60 to 65%. The  left ventricle has normal function. The  left ventricle has no regional  wall motion abnormalities. Left ventricular diastolic parameters are  consistent with Grade I diastolic  dysfunction (impaired relaxation). The average left ventricular global  longitudinal strain is -19.8 %. The global longitudinal strain is normal.   2. Right ventricular systolic function is normal. The right ventricular   size is normal. Tricuspid regurgitation signal is inadequate for assessing  PA pressure.   3. The mitral valve is normal in structure. No evidence of mitral valve  regurgitation. No evidence of mitral stenosis.   4. The aortic valve was not well visualized. Aortic valve regurgitation  is not visualized. No aortic stenosis is present.   5. The inferior vena cava is normal in size with greater than 50%  respiratory variability, suggesting right atrial pressure of 3 mmHg.   Physical Exam:   VS:  BP 126/88 (BP Location: Right Arm, Cuff Size: Large)   Pulse 84   Ht 5' 5 (1.651 m)   Wt 164 lb (74.4 kg)   LMP 09/19/2024 (Approximate)   SpO2 99%   BMI 27.29 kg/m    Wt Readings from Last 3 Encounters:  10/04/24 164 lb (74.4 kg)  09/07/24 167 lb 12.8 oz (76.1 kg)  06/07/24 154 lb 6.4 oz (70 kg)    GEN: Well nourished, well developed in no acute distress while sitting in chair.  NECK: No JVD; No carotid bruits CARDIAC: RRR, no murmurs, rubs, gallops RESPIRATORY:  Clear to auscultation without rales, wheezing or rhonchi  ABDOMEN: Soft, non-tender, non-distended EXTREMITIES:  No edema; No deformity   ASSESSMENT AND PLAN: .   Essential hypertension Multiple ED visits with hypertension that notes medication noncompliance based on chart review.  Reports Home BP of 130's/80-90's with hydrochlorothiazide  50 mg daily.  BP this OV mildly elevated at 126/88 then repeat BP 132/88 Continue on HCTZ 50 mg daily.  Ordered losartan  25 mg daily. BP log 2-4 weeks. Ordered 2-week follow-up BMP.  If BP remains elevated then can either increase Losartan  to 50 mg or switch hydrochlorothiazide  to chlorthalidone .   Previously self discontinued amlodipine  multiple times since it made her feel poorly.  Would recommend continuing to avoid amlodipine  in the future due to poor compliance. Encourage physical activity for 150 minutes per week or as tolerated and heart healthy low sodium diet. Discussed limiting  sodium intake to < 2 grams daily.        Latest Ref Rng & Units 06/07/2024    9:34 AM 11/30/2023    8:07 PM 09/24/2023    6:48 PM  BMP  Glucose 70 - 99 mg/dL 800  841  882   BUN 6 - 24 mg/dL 13  16  13    Creatinine 0.57 - 1.00 mg/dL 9.12  9.00  9.12   BUN/Creat Ratio 9 - 23 15     Sodium 134 - 144 mmol/L 135  131  136   Potassium 3.5 - 5.2 mmol/L 4.2  3.7  3.5   Chloride 96 - 106 mmol/L 99  97  104   CO2 20 - 29 mmol/L 21  26  24    Calcium  8.7 - 10.2 mg/dL 9.4  9.4  8.4    Hyperlipidemia LDL goal <100 Continue Lipitor 20 mg daily Lipid Panel     Component Value Date/Time   CHOL 165 09/07/2024 0859   TRIG 211 (H) 09/07/2024 0859   HDL 33 (L) 09/07/2024 0859   CHOLHDL 5.0 (H) 09/07/2024 0859   LDLCALC 96 09/07/2024 0859   LABVLDL  36 09/07/2024 0859   Type 2 diabetes mellitus with hyperglycemia, without long-term current use of insulin (HCC) Managed by PCP  On semaglutide Previously discontinued Jardiance  due to high cost Lab Results  Component Value Date   HGBA1C 9.0 (H) 09/07/2024   HGBA1C 8.1 (H) 06/07/2024   HGBA1C 7.4 (A) 06/03/2023       Dispo: Follow-up in 3 months with Dr. Mallipeddi or Scottie, PA-C  Signed, Kentavius Dettore Y Cloma Rahrig, PA-C

## 2024-10-04 ENCOUNTER — Encounter: Payer: Self-pay | Admitting: Physician Assistant

## 2024-10-04 ENCOUNTER — Ambulatory Visit: Payer: Self-pay | Attending: Student | Admitting: Physician Assistant

## 2024-10-04 VITALS — BP 126/88 | HR 84 | Ht 65.0 in | Wt 164.0 lb

## 2024-10-04 DIAGNOSIS — E785 Hyperlipidemia, unspecified: Secondary | ICD-10-CM

## 2024-10-04 DIAGNOSIS — E1165 Type 2 diabetes mellitus with hyperglycemia: Secondary | ICD-10-CM

## 2024-10-04 DIAGNOSIS — I10 Essential (primary) hypertension: Secondary | ICD-10-CM

## 2024-10-04 MED ORDER — LOSARTAN POTASSIUM 25 MG PO TABS: 25.0000 mg | ORAL_TABLET | Freq: Every day | ORAL | 3 refills | Status: AC

## 2024-10-04 NOTE — Patient Instructions (Addendum)
 Medication Instructions:   START Losartan  25 mg daily   Keep daily BP log for 2-4 weeks  Labwork: BMET in 2 weeks at [facility]   Testing/Procedures: None today  Follow-Up: 3 months with Dr.Mallipeddi or S.Sheron, PA-C  Any Other Special Instructions Will Be Listed Below (If Applicable).  If you need a refill on your cardiac medications before your next appointment, please call your pharmacy.

## 2024-10-17 ENCOUNTER — Encounter: Payer: Self-pay | Admitting: Family

## 2024-10-17 ENCOUNTER — Ambulatory Visit: Payer: Self-pay | Admitting: Family

## 2024-10-17 VITALS — BP 148/93 | HR 93 | Temp 98.8°F | Resp 16 | Ht 65.0 in | Wt 164.6 lb

## 2024-10-17 DIAGNOSIS — E1165 Type 2 diabetes mellitus with hyperglycemia: Secondary | ICD-10-CM

## 2024-10-17 DIAGNOSIS — Z7985 Long-term (current) use of injectable non-insulin antidiabetic drugs: Secondary | ICD-10-CM

## 2024-10-17 MED ORDER — SEMAGLUTIDE(0.25 OR 0.5MG/DOS) 2 MG/1.5ML ~~LOC~~ SOPN: 0.2500 mg | PEN_INJECTOR | SUBCUTANEOUS | 0 refills | Status: AC

## 2024-10-17 NOTE — Progress Notes (Signed)
 Patient ID: Heather Roach, female    DOB: May 24, 1971  MRN: [id number]  CC: Chronic Conditions Follow-Up  Subjective: Heather Roach is a 53 y.o. female who presents for chronic conditions follow-up.   Her concerns today include:  - Doing well on Semaglutide , no issues/concerns. Denies red flag symptoms associated with diabetes.  - Established with Cardiology for hypertension and related chronic conditions management. She does not complain of red flag symptoms such as but not limited to chest pain, shortness of breath, worst headache of life, nausea/vomiting.   Patient Active Problem List   Diagnosis Date Noted   ERRONEOUS ENCOUNTER--DISREGARD 03/02/2023   Numbness and tingling in left arm 10/28/2022   Essential hypertension 02/21/2022   Tachycardia 02/21/2022   Elevated random blood glucose level 02/21/2022   Hypocalcemia 02/21/2022     Current Outpatient Medications on File Prior to Visit  Medication Sig Dispense Refill   Accu-Chek Softclix Lancets lancets Use as instructed 100 each 12   atorvastatin  (LIPITOR) 40 MG tablet Take 1 tablet (40 mg total) by mouth daily. 90 tablet 0   glucose blood (ACCU-CHEK GUIDE) test strip USE AS DIRECTED TWICE A DAY TO TEST BLOOD SUGARS 100 each 12   hydrochlorothiazide  (HYDRODIURIL ) 50 MG tablet Take 1 tablet (50 mg total) by mouth daily. 90 tablet 0   ibuprofen  (ADVIL ) 600 MG tablet Take 1 tablet (600 mg total) by mouth every 6 (six) hours as needed. 30 tablet 0   losartan  (COZAAR ) 25 MG tablet Take 1 tablet (25 mg total) by mouth daily. 90 tablet 3   Blood Glucose Monitoring Suppl (ACCU-CHEK GUIDE) w/Device KIT 1 Units by Does not apply route daily. 1 kit 0   diphenhydramine -acetaminophen  (TYLENOL  PM) 25-500 MG TABS tablet Take 1 tablet by mouth at bedtime as needed. (Patient not taking: Reported on 07/01/2023)     No current facility-administered medications on file prior to visit.    Allergies  Allergen Reactions   Amlodipine  Other (See  Comments)    Feels poorly    Social History   Socioeconomic History   Marital status: Married    Spouse name: Not on file   Number of children: Not on file   Years of education: Not on file   Highest education level: Not on file  Occupational History   Not on file  Tobacco Use   Smoking status: Never    Passive exposure: Never   Smokeless tobacco: Never  Vaping Use   Vaping status: Never Used  Substance and Sexual Activity   Alcohol use: Not Currently   Drug use: Not Currently   Sexual activity: Yes  Other Topics Concern   Not on file  Social History Narrative   Not on file   Social Drivers of Health   Financial Resource Strain: Low Risk  (06/07/2024)   Overall Financial Resource Strain (CARDIA)    Difficulty of Paying Living Expenses: Not hard at all  Food Insecurity: No Food Insecurity (06/07/2024)   Hunger Vital Sign    Worried About Running Out of Food in the Last Year: Never true    Ran Out of Food in the Last Year: Never true  Transportation Needs: No Transportation Needs (06/07/2024)   PRAPARE - Administrator, Civil Service (Medical): No    Lack of Transportation (Non-Medical): No  Physical Activity: Sufficiently Active (06/07/2024)   Exercise Vital Sign    Days of Exercise per Week: 7 days    Minutes of Exercise per Session: 30  min  Stress: No Stress Concern Present (06/07/2024)   [employer] of Occupational Health - Occupational Stress Questionnaire    Feeling of Stress: Not at all  Social Connections: Moderately Integrated (06/07/2024)   Social Connection and Isolation Panel    Frequency of Communication with Friends and Family: More than three times a week    Frequency of Social Gatherings with Friends and Family: More than three times a week    Attends Religious Services: More than 4 times per year    Active Member of [employer] or Organizations: No    Attends Banker Meetings: Never    Marital Status: Married  Catering Manager  Violence: Not At Risk (06/07/2024)   Humiliation, Afraid, Rape, and Kick questionnaire    Fear of Current or Ex-Partner: No    Emotionally Abused: No    Physically Abused: No    Sexually Abused: No    Family History  Problem Relation Age of Onset   Diabetes Mother    Heart failure Mother    Hypertension Mother    Hypertension Sister    Cancer Sister    Sarcoidosis Sister    Diabetes Brother     Past Surgical History:  Procedure Laterality Date   NO PAST SURGERIES      ROS: Review of Systems Negative except as stated above  PHYSICAL EXAM: BP (!) 148/93   Pulse 93   Temp 98.8 F (37.1 C) (Oral)   Resp 16   Ht 5' 5 (1.651 m)   Wt 164 lb 9.6 oz (74.7 kg)   LMP 09/19/2024 (Approximate)   SpO2 95%   BMI 27.39 kg/m       10/17/2024    9:39 AM 10/17/2024    9:05 AM 10/04/2024    2:12 PM  Vitals with BMI  Height  5' 5 5' 5  Weight  164 lbs 10 oz 164 lbs  BMI  27.39 27.29  Systolic 148 139 873  Diastolic 93 99 88  Pulse  93 84   Physical Exam HENT:     Head: Normocephalic and atraumatic.     Nose: Nose normal.     Mouth/Throat:     Mouth: Mucous membranes are moist.     Pharynx: Oropharynx is clear.  Eyes:     Extraocular Movements: Extraocular movements intact.     Conjunctiva/sclera: Conjunctivae normal.     Pupils: Pupils are equal, round, and reactive to light.  Cardiovascular:     Rate and Rhythm: Normal rate and regular rhythm.     Pulses: Normal pulses.     Heart sounds: Normal heart sounds.  Pulmonary:     Effort: Pulmonary effort is normal.     Breath sounds: Normal breath sounds.  Musculoskeletal:        General: Normal range of motion.     Cervical back: Normal range of motion and neck supple.  Neurological:     General: No focal deficit present.     Mental Status: She is alert and oriented to person, place, and time.  Psychiatric:        Mood and Affect: Mood normal.        Behavior: Behavior normal.     ASSESSMENT AND PLAN: 1.  Type 2 diabetes mellitus with hyperglycemia, without long-term current use of insulin (HCC) (Primary) - Hemoglobin A1c result pending.  - Continue Semagluide as prescribed.  - Discussed the importance of healthy eating habits, low-carbohydrate diet, low-sugar diet, regular aerobic exercise (at least  150 minutes a week as tolerated) and medication compliance to achieve or maintain control of diabetes. Counseled on medication adherence/adverse effects.  - Follow-up with primary provider as scheduled.  - Semaglutide ,0.25 or 0.5MG /DOS, 2 MG/1.5ML SOPN; Inject 0.25 mg into the skin once a week.  Dispense: 6 mL; Refill: 0 - Hemoglobin A1c   Patient was given the opportunity to ask questions.  Patient verbalized understanding of the plan and was able to repeat key elements of the plan. Patient was given clear instructions to go to Emergency Department or return to medical center if symptoms don't improve, worsen, or new problems develop.The patient verbalized understanding.   Orders Placed This Encounter  Procedures   Hemoglobin A1c     Requested Prescriptions   Signed Prescriptions Disp Refills   Semaglutide ,0.25 or 0.5MG /DOS, 2 MG/1.5ML SOPN 6 mL 0    Sig: Inject 0.25 mg into the skin once a week.    Return in about 3 months (around 01/15/2025) for Follow-Up or next available chronic conditions.  Greig JINNY Chute, NP

## 2024-10-17 NOTE — Progress Notes (Signed)
 4 week follow up, medication refill

## 2024-10-18 ENCOUNTER — Ambulatory Visit: Payer: Self-pay | Admitting: Family

## 2024-10-18 DIAGNOSIS — E1165 Type 2 diabetes mellitus with hyperglycemia: Secondary | ICD-10-CM

## 2024-10-18 LAB — HEMOGLOBIN A1C
Est. average glucose Bld gHb Est-mCnc: 189 mg/dL
Hgb A1c MFr Bld: 8.2 % — ABNORMAL HIGH (ref 4.8–5.6)

## 2024-10-18 MED ORDER — SEMAGLUTIDE(0.25 OR 0.5MG/DOS) 2 MG/1.5ML ~~LOC~~ SOPN: 0.5000 mg | PEN_INJECTOR | SUBCUTANEOUS | 0 refills | Status: DC

## 2024-10-19 ENCOUNTER — Ambulatory Visit: Payer: Self-pay | Admitting: Physician Assistant

## 2024-10-19 ENCOUNTER — Other Ambulatory Visit (HOSPITAL_COMMUNITY)
Admission: RE | Admit: 2024-10-19 | Discharge: 2024-10-19 | Disposition: A | Payer: Self-pay | Source: Ambulatory Visit | Attending: Physician Assistant | Admitting: Physician Assistant

## 2024-10-19 LAB — BASIC METABOLIC PANEL WITH GFR
Anion gap: 13 (ref 5–15)
BUN: 17 mg/dL (ref 6–20)
CO2: 23 mmol/L (ref 22–32)
Calcium: 9.5 mg/dL (ref 8.9–10.3)
Chloride: 101 mmol/L (ref 98–111)
Creatinine, Ser: 1 mg/dL (ref 0.44–1.00)
GFR, Estimated: >60 mL/min (ref >60)
Glucose, Bld: 161 mg/dL — ABNORMAL HIGH (ref 70–99)
Potassium: 3.7 mmol/L (ref 3.5–5.1)
Sodium: 136 mmol/L (ref 135–145)

## 2024-12-16 ENCOUNTER — Encounter: Payer: Self-pay | Admitting: Family

## 2024-12-20 ENCOUNTER — Other Ambulatory Visit: Payer: Self-pay | Admitting: Family

## 2024-12-20 DIAGNOSIS — E1165 Type 2 diabetes mellitus with hyperglycemia: Secondary | ICD-10-CM

## 2024-12-20 MED ORDER — SEMAGLUTIDE(0.25 OR 0.5MG/DOS) 2 MG/1.5ML ~~LOC~~ SOPN: 0.5000 mg | PEN_INJECTOR | SUBCUTANEOUS | 0 refills | Status: AC

## 2024-12-20 NOTE — Telephone Encounter (Signed)
 Complete

## 2025-01-04 ENCOUNTER — Ambulatory Visit: Payer: Self-pay | Admitting: Physician Assistant

## 2025-01-16 ENCOUNTER — Ambulatory Visit: Payer: Self-pay | Admitting: Family

## 2025-06-07 ENCOUNTER — Encounter: Payer: Self-pay | Admitting: Family
